# Patient Record
Sex: Female | Born: 1940 | Race: White | Hispanic: No | Marital: Single | Smoking: Never smoker
Health system: Southern US, Community
[De-identification: ages and names within clinical notes are randomized; demographics above are authoritative.]

## PROBLEM LIST (undated history)

## (undated) DIAGNOSIS — Z86010 Personal history of colonic polyps: Secondary | ICD-10-CM

## (undated) DIAGNOSIS — J329 Chronic sinusitis, unspecified: Secondary | ICD-10-CM

## (undated) DIAGNOSIS — E785 Hyperlipidemia, unspecified: Secondary | ICD-10-CM

## (undated) DIAGNOSIS — M1990 Unspecified osteoarthritis, unspecified site: Secondary | ICD-10-CM

## (undated) DIAGNOSIS — M810 Age-related osteoporosis without current pathological fracture: Secondary | ICD-10-CM

## (undated) HISTORY — PX: COLONOSCOPY: SHX174

## (undated) HISTORY — PX: APPENDECTOMY: SHX54

---

## 2004-09-12 ENCOUNTER — Ambulatory Visit: Payer: Self-pay | Admitting: Family Medicine

## 2005-02-18 ENCOUNTER — Ambulatory Visit: Payer: Self-pay | Admitting: Unknown Physician Specialty

## 2005-11-06 ENCOUNTER — Ambulatory Visit: Payer: Self-pay | Admitting: Family Medicine

## 2006-12-30 ENCOUNTER — Ambulatory Visit: Payer: Self-pay | Admitting: Family Medicine

## 2010-09-24 ENCOUNTER — Ambulatory Visit: Payer: Self-pay | Admitting: Unknown Physician Specialty

## 2013-04-27 ENCOUNTER — Emergency Department: Payer: Self-pay | Admitting: Emergency Medicine

## 2013-04-27 LAB — CBC WITH DIFFERENTIAL/PLATELET
BASOS PCT: 1.1 %
Basophil #: 0.1 10*3/uL (ref 0.0–0.1)
EOS PCT: 3.3 %
Eosinophil #: 0.3 10*3/uL (ref 0.0–0.7)
HCT: 38.7 % (ref 35.0–47.0)
HGB: 13.5 g/dL (ref 12.0–16.0)
LYMPHS ABS: 2.7 10*3/uL (ref 1.0–3.6)
Lymphocyte %: 36.1 %
MCH: 32.5 pg (ref 26.0–34.0)
MCHC: 34.8 g/dL (ref 32.0–36.0)
MCV: 93 fL (ref 80–100)
MONO ABS: 0.8 x10 3/mm (ref 0.2–0.9)
Monocyte %: 10.3 %
NEUTROS ABS: 3.7 10*3/uL (ref 1.4–6.5)
Neutrophil %: 49.2 %
PLATELETS: 205 10*3/uL (ref 150–440)
RBC: 4.15 10*6/uL (ref 3.80–5.20)
RDW: 12.7 % (ref 11.5–14.5)
WBC: 7.5 10*3/uL (ref 3.6–11.0)

## 2013-04-27 LAB — BASIC METABOLIC PANEL
Anion Gap: 2 — ABNORMAL LOW (ref 7–16)
BUN: 10 mg/dL (ref 7–18)
CALCIUM: 9.4 mg/dL (ref 8.5–10.1)
CHLORIDE: 107 mmol/L (ref 98–107)
CREATININE: 0.57 mg/dL — AB (ref 0.60–1.30)
Co2: 29 mmol/L (ref 21–32)
Glucose: 81 mg/dL (ref 65–99)
OSMOLALITY: 274 (ref 275–301)
Potassium: 4.1 mmol/L (ref 3.5–5.1)
Sodium: 138 mmol/L (ref 136–145)

## 2015-11-03 ENCOUNTER — Encounter: Payer: Self-pay | Admitting: *Deleted

## 2015-11-06 ENCOUNTER — Ambulatory Visit: Payer: Medicare Other | Admitting: Anesthesiology

## 2015-11-06 ENCOUNTER — Ambulatory Visit
Admission: RE | Admit: 2015-11-06 | Discharge: 2015-11-06 | Disposition: A | Discharge status: Home / Self Care | Payer: Medicare Other | Source: Ambulatory Visit | Attending: Unknown Physician Specialty | Admitting: Unknown Physician Specialty

## 2015-11-06 ENCOUNTER — Encounter
Admission: RE | Disposition: A | Discharge status: Home / Self Care | Payer: Self-pay | Source: Ambulatory Visit | Attending: Unknown Physician Specialty

## 2015-11-06 DIAGNOSIS — E669 Obesity, unspecified: Secondary | ICD-10-CM | POA: Diagnosis not present

## 2015-11-06 DIAGNOSIS — M1990 Unspecified osteoarthritis, unspecified site: Secondary | ICD-10-CM | POA: Diagnosis not present

## 2015-11-06 DIAGNOSIS — Z86010 Personal history of colonic polyps: Secondary | ICD-10-CM | POA: Insufficient documentation

## 2015-11-06 DIAGNOSIS — M810 Age-related osteoporosis without current pathological fracture: Secondary | ICD-10-CM | POA: Insufficient documentation

## 2015-11-06 DIAGNOSIS — K635 Polyp of colon: Secondary | ICD-10-CM | POA: Insufficient documentation

## 2015-11-06 DIAGNOSIS — K640 First degree hemorrhoids: Secondary | ICD-10-CM | POA: Diagnosis not present

## 2015-11-06 DIAGNOSIS — Z880 Allergy status to penicillin: Secondary | ICD-10-CM | POA: Diagnosis not present

## 2015-11-06 DIAGNOSIS — Z6835 Body mass index (BMI) 35.0-35.9, adult: Secondary | ICD-10-CM | POA: Insufficient documentation

## 2015-11-06 DIAGNOSIS — Z1211 Encounter for screening for malignant neoplasm of colon: Secondary | ICD-10-CM | POA: Diagnosis present

## 2015-11-06 DIAGNOSIS — E785 Hyperlipidemia, unspecified: Secondary | ICD-10-CM | POA: Insufficient documentation

## 2015-11-06 HISTORY — DX: Age-related osteoporosis without current pathological fracture: M81.0

## 2015-11-06 HISTORY — DX: Personal history of colonic polyps: Z86.010

## 2015-11-06 HISTORY — DX: Hyperlipidemia, unspecified: E78.5

## 2015-11-06 HISTORY — DX: Unspecified osteoarthritis, unspecified site: M19.90

## 2015-11-06 HISTORY — DX: Chronic sinusitis, unspecified: J32.9

## 2015-11-06 HISTORY — PX: COLONOSCOPY WITH PROPOFOL: SHX5780

## 2015-11-06 SURGERY — COLONOSCOPY WITH PROPOFOL
Anesthesia: General

## 2015-11-06 MED ORDER — PROPOFOL 500 MG/50ML IV EMUL
INTRAVENOUS | Status: DC | PRN
  Administered 2015-11-06: 75 ug/kg/min via INTRAVENOUS

## 2015-11-06 MED ORDER — PROPOFOL 10 MG/ML IV BOLUS
INTRAVENOUS | Status: DC | PRN
  Administered 2015-11-06: 30 mg via INTRAVENOUS
  Administered 2015-11-06: 20 mg via INTRAVENOUS

## 2015-11-06 MED ORDER — MIDAZOLAM HCL 5 MG/5ML IJ SOLN
INTRAMUSCULAR | Status: DC | PRN
  Administered 2015-11-06 (×2): 0.5 mg via INTRAVENOUS

## 2015-11-06 MED ORDER — LIDOCAINE HCL (PF) 2 % IJ SOLN
INTRAMUSCULAR | Status: DC | PRN
  Administered 2015-11-06: 50 mg

## 2015-11-06 MED ORDER — FENTANYL CITRATE (PF) 100 MCG/2ML IJ SOLN
INTRAMUSCULAR | Status: DC | PRN
  Administered 2015-11-06: 50 ug via INTRAVENOUS

## 2015-11-06 MED ORDER — PHENYLEPHRINE HCL 10 MG/ML IJ SOLN
INTRAMUSCULAR | Status: DC | PRN
  Administered 2015-11-06: 100 ug via INTRAVENOUS
  Administered 2015-11-06: 200 ug via INTRAVENOUS

## 2015-11-06 MED ORDER — SODIUM CHLORIDE 0.9 % IV SOLN: INTRAVENOUS | Status: DC

## 2015-11-06 MED ORDER — SODIUM CHLORIDE 0.9 % IV SOLN
INTRAVENOUS | Status: DC
  Administered 2015-11-06: 1000 mL via INTRAVENOUS

## 2015-11-06 MED ORDER — EPHEDRINE SULFATE 50 MG/ML IJ SOLN
INTRAMUSCULAR | Status: DC | PRN
  Administered 2015-11-06: 5 mg via INTRAVENOUS
  Administered 2015-11-06: 10 mg via INTRAVENOUS
  Administered 2015-11-06 (×2): 5 mg via INTRAVENOUS

## 2015-11-06 NOTE — Transfer of Care (Signed)
Immediate Anesthesia Transfer of Care Note  Patient: Rhonda Bean  Procedure(s) Performed: Procedure(s): COLONOSCOPY WITH PROPOFOL (N/A)  Patient Location: PACU  Anesthesia Type:General  Level of Consciousness: sedated  Airway & Oxygen Therapy: Patient Spontanous Breathing and Patient connected to nasal cannula oxygen  Post-op Assessment: Report given to RN and Post -op Vital signs reviewed and stable  Post vital signs: Reviewed and stable  Last Vitals:  Vitals:   11/06/15 0746 11/06/15 0850  BP: 131/74 (!) 105/57  Pulse: 82   Resp: 16 16  Temp: 36.5 C (!) 36 C    Last Pain:  Vitals:   11/06/15 0746  TempSrc: Tympanic         Complications: No apparent anesthesia complications

## 2015-11-06 NOTE — H&P (Signed)
   Primary Care Physician:  No PCP Per Patient Primary Gastroenterologist:  Dr. Mechele CollinElliott  Pre-Procedure History & Physical: HPI:  Rhonda Bean is a 75 y.o. female is here [facility] an [facility]   Past Medical History:  Diagnosis Date  . Arthritis   . Chronic sinusitis   . History of colon polyps   . Hyperlipidemia   . Osteoporosis, postmenopausal     Past Surgical History:  Procedure Laterality Date  . APPENDECTOMY    . COLONOSCOPY      Prior to Admission medications   Medication Sig Start Date End Date Taking? Authorizing Provider  naproxen sodium (ANAPROX) 220 MG tablet Take 220 mg by mouth 2 (two) times daily with a meal.   Yes Historical Provider, MD  psyllium (REGULOID) 0.52 g capsule Take 0.52 g by mouth daily.    Historical Provider, MD    Allergies as of 10/27/2015  . (Not on File)    History reviewed. No pertinent family history.  Social History   Social History  . Marital status: Single    Spouse name: N/A  . Number of children: N/A  . Years of education: N/A   Occupational History  . Not on file.   Social History Main Topics  . Smoking status: Never Smoker  . Smokeless tobacco: Never Used  . Alcohol use No  . Drug use: No  . Sexual activity: Not on file   Other Topics Concern  . Not on file   Social History Narrative  . No narrative on file    Review of Systems: See HPI, otherwise negative ROS  Physical Exam: BP 131/74   Pulse 82   Temp 97.7 F (36.5 C) (Tympanic)   Resp 16   Ht 5' (1.524 m)   Wt 81.6 kg (180 lb)   SpO2 97%   BMI 35.15 kg/m  General:   Alert,  pleasant and cooperative in NAD Head:  Normocephalic and atraumatic. Neck:  Supple; no masses or thyromegaly. Lungs:  Clear throughout to auscultation.    Heart:  Regular rate and rhythm. Abdomen:  Soft, nontender and nondistended. Normal bowel sounds, without guarding, and without rebound.   Neurologic:  Alert and  oriented x4;  grossly normal  neurologically.  Impression/Plan: Rhonda Bean is here for an colonoscopy to be [facility]  Risks, benefits, limitations, and alternatives regarding  colonoscopy have been reviewed with the patient.  Questions have been answered.  All parties agreeable.   Lynnae PrudeELLIOTT, Lasheka Kempner, MD  11/06/2015, 8:15 AM

## 2015-11-06 NOTE — Op Note (Signed)
[facility] Gastroenterology Patient Name: Rhonda Bean Procedure Date: 11/06/2015 8:14 AM MRN: [id number] Account #: [id number] Date of Birth: Jul 18, 1940 Admit Type: Outpatient Age: [age] Room[facility] ENDO ROOM 4 Gender: Female Note Status: Finalized Procedure:            Colonoscopy Indications:          High risk colon cancer surveillance: Personal history                        of colonic polyps Providers:            Scot Junobert T. Elliott, MD Referring MD:         No Local Md, MD (Referring MD) Medicines:            Propofol per Anesthesia Complications:        No immediate complications. Procedure:            Pre-Anesthesia Assessment:                       - After reviewing the risks and benefits, the patient                        was deemed in satisfactory condition to undergo the                        procedure.                       After obtaining informed consent, the colonoscope was                        passed under direct vision. Throughout the procedure,                        the patient's blood pressure, pulse, and oxygen                        saturations were monitored continuously. The                        Colonoscope was introduced through the anus and                        advanced to the the cecum, identified by appendiceal                        orifice and ileocecal valve. The colonoscopy was                        somewhat difficult due to significant looping.                        Successful completion of the procedure was aided by                        applying abdominal pressure. The patient tolerated the                        procedure well. The quality of the bowel preparation  was excellent. Findings:      A diminutive polyp was found in the hepatic flexure. The polyp was       sessile. The polyp was removed with a jumbo cold forceps. Resection and       retrieval were complete.      A few sessile polyps  were found in the sigmoid colon. The polyps were       diminutive in size. These polyps were removed with a jumbo cold forceps.       Resection and retrieval were complete.      Internal hemorrhoids were found during endoscopy. The hemorrhoids were       small and Grade I (internal hemorrhoids that do not prolapse).      The exam was otherwise without abnormality. Impression:           - One diminutive polyp at the hepatic flexure, removed                        with a jumbo cold forceps. Resected and retrieved.                       - A few diminutive polyps in the sigmoid colon, removed                        with a jumbo cold forceps. Resected and retrieved.                       - Internal hemorrhoids.                       - The examination was otherwise normal. Recommendation:       - Await pathology results. Scot Jun, MD 11/06/2015 8:50:58 AM This report has been signed electronically. Number of Addenda: 0 Note Initiated On: 11/06/2015 8:14 AM Scope Withdrawal Time: 0 hours 13 minutes 57 seconds  Total Procedure Duration: 0 hours 24 minutes 0 seconds       [facility]

## 2015-11-06 NOTE — Anesthesia Postprocedure Evaluation (Signed)
Anesthesia Post Note  Patient: Rhonda Bean  Procedure(s) Performed: Procedure(s) (LRB): COLONOSCOPY WITH PROPOFOL (N/A)  Patient location during evaluation: Endoscopy Anesthesia Type: General Level of consciousness: awake and alert and oriented Pain management: pain level controlled Vital Signs Assessment: post-procedure vital signs reviewed and stable Respiratory status: spontaneous breathing, nonlabored ventilation and respiratory function stable Cardiovascular status: blood pressure returned to baseline and stable Postop Assessment: no signs of nausea or vomiting Anesthetic complications: no    Last Vitals:  Vitals:   11/06/15 0910 11/06/15 0920  BP: 115/63 126/73  Pulse: 72 74  Resp: 20 19  Temp:      Last Pain:  Vitals:   11/06/15 0746  TempSrc: Tympanic                 Rei Medlen

## 2015-11-06 NOTE — Anesthesia Preprocedure Evaluation (Signed)
Anesthesia Evaluation  Patient identified by MRN, date of birth, ID band Patient awake    Reviewed: Allergy & Precautions, NPO status , Patient's Chart, lab work & pertinent test results  Airway Mallampati: II  TM Distance: >3 FB Neck ROM: Full    Dental no notable dental hx.    Pulmonary neg pulmonary ROS, neg sleep apnea, neg COPD,    breath sounds clear to auscultation- rhonchi (-) wheezing      Cardiovascular Exercise Tolerance: Good (-) hypertension(-) angina Rhythm:Regular Rate:Normal - Systolic murmurs and - Diastolic murmurs    Neuro/Psych negative neurological ROS  negative psych ROS   GI/Hepatic negative GI ROS, Neg liver ROS,   Endo/Other  negative endocrine ROSneg diabetes  Renal/GU negative Renal ROS     Musculoskeletal  (+) Arthritis , Osteoarthritis,    Abdominal (+) + obese,   Peds  Hematology   Anesthesia Other Findings Past Medical History: No date: Arthritis No date: Chronic sinusitis No date: History of colon polyps No date: Hyperlipidemia No date: Osteoporosis, postmenopausal   Reproductive/Obstetrics                             Anesthesia Physical Anesthesia Plan  ASA: II  Anesthesia Plan: General   Post-op Pain Management:    Induction: Intravenous  Airway Management Planned: Natural Airway  Additional Equipment:   Intra-op Plan:   Post-operative Plan:   Informed Consent: I have reviewed the patients History and Physical, chart, labs and discussed the procedure including the risks, benefits and alternatives for the proposed anesthesia with the patient or authorized representative who has indicated his/her understanding and acceptance.   Dental advisory given  Plan Discussed with: Anesthesiologist and CRNA  Anesthesia Plan Comments:         Anesthesia Quick Evaluation

## 2015-11-07 ENCOUNTER — Encounter: Payer: Self-pay | Admitting: Unknown Physician Specialty

## 2015-11-07 LAB — SURGICAL PATHOLOGY

## 2017-03-05 ENCOUNTER — Emergency Department
Admission: EM | Admit: 2017-03-05 | Discharge: 2017-03-05 | Disposition: A | Discharge status: Home / Self Care | Payer: Medicare Other | Attending: Emergency Medicine | Admitting: Emergency Medicine

## 2017-03-05 ENCOUNTER — Emergency Department: Payer: Medicare Other

## 2017-03-05 ENCOUNTER — Other Ambulatory Visit: Payer: Self-pay

## 2017-03-05 DIAGNOSIS — Y929 Unspecified place or not applicable: Secondary | ICD-10-CM | POA: Diagnosis not present

## 2017-03-05 DIAGNOSIS — S3992XA Unspecified injury of lower back, initial encounter: Secondary | ICD-10-CM | POA: Diagnosis present

## 2017-03-05 DIAGNOSIS — S39012A Strain of muscle, fascia and tendon of lower back, initial encounter: Secondary | ICD-10-CM | POA: Insufficient documentation

## 2017-03-05 DIAGNOSIS — Y939 Activity, unspecified: Secondary | ICD-10-CM | POA: Diagnosis not present

## 2017-03-05 DIAGNOSIS — M5416 Radiculopathy, lumbar region: Secondary | ICD-10-CM | POA: Diagnosis not present

## 2017-03-05 DIAGNOSIS — X509XXA Other and unspecified overexertion or strenuous movements or postures, initial encounter: Secondary | ICD-10-CM | POA: Diagnosis not present

## 2017-03-05 DIAGNOSIS — Z79899 Other long term (current) drug therapy: Secondary | ICD-10-CM | POA: Insufficient documentation

## 2017-03-05 DIAGNOSIS — Y999 Unspecified external cause status: Secondary | ICD-10-CM | POA: Insufficient documentation

## 2017-03-05 MED ORDER — KETOROLAC TROMETHAMINE 10 MG PO TABS: 10.0000 mg | ORAL_TABLET | Freq: Three times a day (TID) | ORAL | 0 refills | Status: DC

## 2017-03-05 MED ORDER — KETOROLAC TROMETHAMINE 30 MG/ML IJ SOLN
30.0000 mg | Freq: Once | INTRAMUSCULAR | Status: AC
  Administered 2017-03-05: 30 mg via INTRAMUSCULAR
  Filled 2017-03-05: qty 1

## 2017-03-05 MED ORDER — HYDROCODONE-ACETAMINOPHEN 5-325 MG PO TABS: 1.0000 | ORAL_TABLET | Freq: Three times a day (TID) | ORAL | 0 refills | Status: DC | PRN

## 2017-03-05 MED ORDER — CYCLOBENZAPRINE HCL 5 MG PO TABS: 5.0000 mg | ORAL_TABLET | Freq: Three times a day (TID) | ORAL | 0 refills | Status: DC | PRN

## 2017-03-05 NOTE — ED Provider Notes (Signed)
[facility] Emergency Department Provider Note ____________________________________________  Time seen: 1220  I have reviewed the triage vital signs and the nursing notes.  HISTORY  Chief Complaint  Sciatica  HPI Rhonda Bean is a 77 y.o. female presents herself to the ED for evaluation of intermittent low back pain with some referral into the left thigh and leg for the last 2 weeks.  Patient was evaluated by [facility] urgent care about 2 weeks prior.  During that visit she was placed on prednisone as well as hydrocodone.  A prescription for Skelaxin was not approved by her insurance.  She completed the course but continues to experience pain and tingling to the left anterior thigh primarily.  She denies any foot drop, bladder or bowel incontinence, or leg weakness.  Injury, accident, falls, or trauma to the back.  She denies any history of ongoing or chronic low back pain.  She does note a history of osteoporosis and osteopenia.  She presents to the ED for evaluation management of her symptoms.  Past Medical History:  Diagnosis Date  . Arthritis   . Chronic sinusitis   . History of colon polyps   . Hyperlipidemia   . Osteoporosis, postmenopausal     There are no active problems to display for this patient.   Past Surgical History:  Procedure Laterality Date  . APPENDECTOMY    . COLONOSCOPY    . COLONOSCOPY WITH PROPOFOL N/A 11/06/2015   Procedure: COLONOSCOPY WITH PROPOFOL;  Surgeon: Scot Jun, MD;  Location: [facility] ENDOSCOPY;  Service: Endoscopy;  Laterality: N/A;    Prior to Admission medications   Medication Sig Start Date End Date Taking? Authorizing Provider  cyclobenzaprine (FLEXERIL) 5 MG tablet Take 1 tablet (5 mg total) by mouth 3 (three) times daily as needed for muscle spasms. 03/05/17   Neiman Roots, Charlesetta Ivory, PA-C  HYDROcodone-acetaminophen (NORCO) 5-325 MG tablet Take 1 tablet by mouth 3 (three) times daily as needed. 03/05/17    Marshawn Ninneman, Charlesetta Ivory, PA-C  ketorolac (TORADOL) 10 MG tablet Take 1 tablet (10 mg total) by mouth every 8 (eight) hours. 03/05/17   Ekta Dancer, Charlesetta Ivory, PA-C  naproxen sodium (ANAPROX) 220 MG tablet Take 220 mg by mouth 2 (two) times daily with a meal.    [provider]  psyllium (REGULOID) 0.52 g capsule Take 0.52 g by mouth daily.    [provider]    Allergies Penicillins  No family history on file.  Social History Social History   Tobacco Use  . Smoking status: Never Smoker  . Smokeless tobacco: Never Used  Substance Use Topics  . Alcohol use: No  . Drug use: No    Review of Systems  Constitutional: Negative for fever. Cardiovascular: Negative for chest pain. Respiratory: Negative for shortness of breath. Gastrointestinal: Negative for abdominal pain, vomiting and diarrhea. Genitourinary: Negative for dysuria. Negative for incontinence Musculoskeletal: Positive for back pain with left lumbar radiculopathy.. Skin: Negative for rash. Neurological: Negative for headaches, focal weakness or numbness. ____________________________________________  PHYSICAL EXAM:  VITAL SIGNS: ED Triage Vitals  Enc Vitals Group     BP 03/05/17 1130 (!) 149/81     Pulse Rate 03/05/17 1130 89     Resp 03/05/17 1130 16     Temp 03/05/17 1130 98.1 F (36.7 C)     Temp Source 03/05/17 1130 Oral     SpO2 03/05/17 1130 95 %     Weight 03/05/17 1131 170 lb (77.1 kg)  Height 03/05/17 1131 5' (1.524 m)     Head Circumference --      Peak Flow --      Pain Score 03/05/17 1123 7     Pain Loc --      Pain Edu? --      Excl. in GC? --     Constitutional: Alert and oriented. Well appearing and in no distress. Head: Normocephalic and atraumatic. Cardiovascular: Normal rate, regular rhythm. Normal distal pulses. Respiratory: Normal respiratory effort. No wheezes/rales/rhonchi. Gastrointestinal: Soft and nontender. No distention. Musculoskeletal: No spinal  alignment without midline tenderness, spasm, deformity, or step-off.  Patient transitions from sit to stand without assistance.  She is able to demonstrate normal lumbar flexion and extension range.  She localizes paresthesias to the anterior lateral left thigh.  She also reports some mild lateral calf discomfort. Nontender with normal range of motion in all extremities.  Neurologic: Cranial nerves II through XII grossly intact.  Normal LE DTRs bilaterally.  Normal gait without ataxia. Normal speech and language. No gross focal neurologic deficits are appreciated. Skin:  Skin is warm, dry and intact. No rash noted. ___________________________________________   RADIOLOGY  Lumbar Spine  IMPRESSION: 1. No definite acute findings. 2. 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 3. Chronic L1 compression deformity.  I, Luca Dyar, Charlesetta IvoryJenise V Bacon, personally viewed and evaluated these images (plain radiographs) as part of my medical decision making, as well as reviewing the written report by the radiologist. ____________________________________________  PROCEDURES  Procedures Toradol 30 mg IM ____________________________________________  INITIAL IMPRESSION / ASSESSMENT AND PLAN / ED COURSE  Geriatric patient with a ED evaluation of a 2-week but of intermittent low back pain with right lower extremity referral.  Patient's exam is consistent with some mild lumbar strain with left lumbar radiculopathy.  Patient's x-ray shows some mild anterior listhesis of L3 on 4 was consistent with her radicular symptoms.  She otherwise is neurologically intact on presentation.  She reports improvement of her symptoms after medication administration in the ED.  She is discharged at this time with a prescription for ketorolac, cyclobenzaprine, and hydrocodone.  She will follow-up with her primary care provider  I reviewed the patient's prescription history over the last 12 months in  the multi-state controlled substances database(s) that includes [place], [place], [place], [place], [place], [place], [place], [place], [place], [place], and [place].  Results were notable for her recent narcotic prescription 02/19/2017. ____________________________________________  FINAL CLINICAL IMPRESSION(S) / ED DIAGNOSES  Final diagnoses:  Lumbar strain, initial encounter  Acute left lumbar radiculopathy      Lissa HoardMenshew, Judyann Casasola V Bacon, PA-C 03/05/17 1907    Emily FilbertWilliams, Jonathan E, MD 03/06/17 [phone]

## 2017-03-05 NOTE — Discharge Instructions (Signed)
Your exam and x-ray are consistent with a lumbar strain and mild lumbar nerve irritation. Take the prescription meds as directed. Follow-up with your provider or Dr. Ernest PineHooten for continued symptoms. Return to the ED immediately for leg weakness or incontinence.

## 2017-03-05 NOTE — ED Triage Notes (Signed)
Pt c/o left lower back pain that radiates into the buttock and leg for the past 2 weeks.

## 2017-03-11 ENCOUNTER — Encounter: Payer: Self-pay | Admitting: Emergency Medicine

## 2017-03-11 ENCOUNTER — Other Ambulatory Visit: Payer: Self-pay

## 2017-03-11 ENCOUNTER — Emergency Department
Admission: EM | Admit: 2017-03-11 | Discharge: 2017-03-11 | Disposition: A | Discharge status: Home / Self Care | Payer: Medicare Other | Attending: Emergency Medicine | Admitting: Emergency Medicine

## 2017-03-11 ENCOUNTER — Emergency Department: Payer: Medicare Other

## 2017-03-11 DIAGNOSIS — Z79899 Other long term (current) drug therapy: Secondary | ICD-10-CM | POA: Insufficient documentation

## 2017-03-11 DIAGNOSIS — M1612 Unilateral primary osteoarthritis, left hip: Secondary | ICD-10-CM | POA: Diagnosis not present

## 2017-03-11 DIAGNOSIS — M79604 Pain in right leg: Secondary | ICD-10-CM | POA: Diagnosis present

## 2017-03-11 LAB — COMPREHENSIVE METABOLIC PANEL
ALBUMIN: 4.1 g/dL (ref 3.5–5.0)
ALT: 21 U/L (ref 14–54)
ANION GAP: 11 (ref 5–15)
AST: 33 U/L (ref 15–41)
Alkaline Phosphatase: 68 U/L (ref 38–126)
BUN: 17 mg/dL (ref 6–20)
CALCIUM: 9.6 mg/dL (ref 8.9–10.3)
CO2: 22 mmol/L (ref 22–32)
Chloride: 102 mmol/L (ref 101–111)
Creatinine, Ser: 0.75 mg/dL (ref 0.44–1.00)
GFR calc Af Amer: >60 mL/min (ref >60)
GFR calc non Af Amer: >60 mL/min (ref >60)
GLUCOSE: 112 mg/dL — AB (ref 65–99)
Potassium: 3.5 mmol/L (ref 3.5–5.1)
SODIUM: 135 mmol/L (ref 135–145)
Total Bilirubin: 0.7 mg/dL (ref 0.3–1.2)
Total Protein: 7.6 g/dL (ref 6.5–8.1)

## 2017-03-11 LAB — CBC WITH DIFFERENTIAL/PLATELET
BASOS PCT: 1 %
Basophils Absolute: 0.1 10*3/uL (ref 0–0.1)
EOS ABS: 0.1 10*3/uL (ref 0–0.7)
Eosinophils Relative: 1 %
HCT: 38.8 % (ref 35.0–47.0)
HEMOGLOBIN: 13.4 g/dL (ref 12.0–16.0)
Lymphocytes Relative: 14 %
Lymphs Abs: 1.5 10*3/uL (ref 1.0–3.6)
MCH: 32.4 pg (ref 26.0–34.0)
MCHC: 34.6 g/dL (ref 32.0–36.0)
MCV: 93.7 fL (ref 80.0–100.0)
Monocytes Absolute: 0.7 10*3/uL (ref 0.2–0.9)
Monocytes Relative: 7 %
Neutro Abs: 8.4 10*3/uL — ABNORMAL HIGH (ref 1.4–6.5)
Neutrophils Relative %: 77 %
Platelets: 187 10*3/uL (ref 150–440)
RBC: 4.14 MIL/uL (ref 3.80–5.20)
RDW: 12.6 % (ref 11.5–14.5)
WBC: 10.7 10*3/uL (ref 3.6–11.0)

## 2017-03-11 LAB — MAGNESIUM: Magnesium: 1.6 mg/dL — ABNORMAL LOW (ref 1.7–2.4)

## 2017-03-11 MED ORDER — MELOXICAM 7.5 MG PO TABS: 7.5000 mg | ORAL_TABLET | Freq: Every day | ORAL | 0 refills | Status: AC

## 2017-03-11 MED ORDER — MAGNESIUM 65 MG PO TABS: 1.0000 | ORAL_TABLET | Freq: Once | ORAL | 0 refills | Status: AC

## 2017-03-11 MED ORDER — MAGNESIUM CHLORIDE 64 MG PO TBEC
1.0000 | DELAYED_RELEASE_TABLET | Freq: Every day | ORAL | Status: DC
  Administered 2017-03-11: 64 mg via ORAL
  Filled 2017-03-11: qty 1

## 2017-03-11 NOTE — ED Provider Notes (Signed)
[facility] Emergency Department Provider Note  ____________________________________________   First MD Initiated Contact with Patient 03/11/17 1348     (approximate)  I have reviewed the triage vital signs and the nursing notes.   HISTORY  Chief Complaint Spasms  HPI Rhonda Bean is a 77 y.o. female is here  complaining of left leg and hip pain for 2 weeks.  Patient was seen in the ED on 03/05/17 at which time x-rays were done of her back.  Patient states that the medication she was prescribed did not help with her leg pain.  She denies any injury.  There is been no traveling and she denies any history of DVT.  She is unaware of any edema to her lower extremity.  She states it is the same as it was when she was seen on the 23rd.  She has taken over-the-counter Aleve with some improvement of her hip pain.  She continues to ambulate with the use of a cane.  She describes her pain as a cramping sensation which is increased with range of motion.  Rates her pain as 5/10.   Past Medical History:  Diagnosis Date  . Arthritis   . Chronic sinusitis   . History of colon polyps   . Hyperlipidemia   . Osteoporosis, postmenopausal     There are no active problems to display for this patient.   Past Surgical History:  Procedure Laterality Date  . APPENDECTOMY    . COLONOSCOPY    . COLONOSCOPY WITH PROPOFOL N/A 11/06/2015   Procedure: COLONOSCOPY WITH PROPOFOL;  Surgeon: Scot Junobert T Elliott, MD;  [facility];  Service: Endoscopy;  Laterality: N/A;    Prior to Admission medications   Medication Sig Start Date End Date Taking? Authorizing Provider  Magnesium 65 MG TABS Take 1 tablet (65 mg total) by mouth once for 1 dose. 03/11/17 03/11/17  Tommi RumpsSummers, Aziyah Provencal L, PA-C  meloxicam (MOBIC) 7.5 MG tablet Take 1 tablet (7.5 mg total) by mouth daily. 03/11/17 03/11/18  Bridget HartshornSummers, Shaletta Hinostroza L, PA-C  psyllium (REGULOID) 0.52 g capsule Take 0.52 g by mouth daily.     [provider]    Allergies Penicillins  No family history on file.  Social History Social History   Tobacco Use  . Smoking status: Never Smoker  . Smokeless tobacco: Never Used  Substance Use Topics  . Alcohol use: No  . Drug use: No    Review of Systems Constitutional: No fever/chills Cardiovascular: Denies chest pain. Respiratory: Denies shortness of breath. Genitourinary: Negative for dysuria. Musculoskeletal: Positive for left lower leg and hip pain. Skin: Negative for rash. Neurological: Negative for headaches, focal weakness or numbness. ____________________________________________   PHYSICAL EXAM:  VITAL SIGNS: ED Triage Vitals [03/11/17 1324]  Enc Vitals Group     BP (!) 145/58     Pulse Rate 62     Resp 20     Temp 98.7 F (37.1 C)     Temp Source Oral     SpO2 98 %     Weight      Height      Head Circumference      Peak Flow      Pain Score 5     Pain Loc      Pain Edu?      Excl. in GC?    Constitutional: Alert and oriented. Well appearing and in no acute distress. Eyes: Conjunctivae are normal.  Head: Atraumatic. Neck: No stridor.  Cardiovascular: Normal rate, regular rhythm. Grossly normal heart sounds.  Good peripheral circulation. Respiratory: Normal respiratory effort.  No retractions. Lungs CTAB. Gastrointestinal: Soft and nontender. No distention.  Musculoskeletal: On examination of the left lower extremity there is no gross deformity, no soft tissue swelling or edema, no ecchymosis or abrasions seen.  Range of motion reproduces pain with abduction.  No warmth or point tenderness is noted.  Homans sign is negative.  Pulses present.  Motor sensory function intact.  Skin intact.  There is no shortening or rotation of the left lower extremity in comparison with the right. Neurologic:  Normal speech and language. No gross focal neurologic deficits are appreciated.  Skin:  Skin is warm, dry and intact. No rash noted. Psychiatric:  Mood and affect are normal. Speech and behavior are normal.  ____________________________________________   LABS (all labs ordered are listed, but only abnormal results are displayed)  Labs Reviewed  CBC WITH DIFFERENTIAL/PLATELET - Abnormal; Notable for the following components:      Result Value   Neutro Abs 8.4 (*)    All other components within normal limits  COMPREHENSIVE METABOLIC PANEL - Abnormal; Notable for the following components:   Glucose, Bld 112 (*)    All other components within normal limits  MAGNESIUM - Abnormal; Notable for the following components:   Magnesium 1.6 (*)    All other components within normal limits    RADIOLOGY  No acute injury is seen.  There is narrowing of the bone space involving the head of the femur.  ____________________________________________   PROCEDURES  Procedure(s) performed: None  Procedures  Critical Care performed: No  ____________________________________________   INITIAL IMPRESSION / ASSESSMENT AND PLAN / ED COURSE Patient does not have a PCP.  She is encouraged to obtain a primary care provider and was given a list of clinics in the area to begin calling to see if they are taking new patients.  Patient was made aware that her magnesium was slightly low and that we would want a prescription for limited amount to see if this helps with her leg cramping.  Patient was also given a prescription for meloxicam 7.5 mg 1 daily with food #14 with no refill.  Patient was made aware that she is not to take any other anti-inflammatories with this medication but that Tylenol would be safe if extra analgesia is needed.  ____________________________________________   FINAL CLINICAL IMPRESSION(S) / ED DIAGNOSES  Final diagnoses:  Right leg pain  Hypomagnesemia  Osteoarthritis of left hip, unspecified osteoarthritis type     ED Discharge Orders        Ordered    meloxicam (MOBIC) 7.5 MG tablet  Daily     03/11/17 1608     Magnesium 65 MG TABS   Once     03/11/17 1608       Note:  This document was prepared using Dragon voice recognition software and may include unintentional dictation errors.    Tommi Rumps, PA-C 03/11/17 1624    Schaevitz, Myra Rude, MD 03/12/17 [phone]

## 2017-03-11 NOTE — ED Notes (Signed)
FN: pt reports left leg pain today, hx of nerve damage in lower back.

## 2017-03-11 NOTE — Discharge Instructions (Signed)
Begin taking magnesium 1 tablet/day.  The first dose was given to you in the emergency department.  Also meloxicam 7.5 mg 1 daily with food.  It is also to your advantage to obtain a primary care provider.  A number of clinics is listed on your discharge papers to begin calling to see if they are taking new patients.  If you continue to have problems with your hip you may want to follow-up with an orthopedist.  Taking the meloxicam once a day should help.  Do not take over-the-counter Aleve with this medication.  You may take Tylenol if you need further pain medication.

## 2017-03-11 NOTE — ED Triage Notes (Signed)
Pt with left leg and hip pain for two weeks.

## 2017-03-11 NOTE — ED Notes (Signed)
See triage note  Presents with pain and muscle spasms to left hip and leg   States she stepped wrong and then caught herself before she fell  States pain became worse

## 2017-04-17 ENCOUNTER — Other Ambulatory Visit: Payer: Self-pay | Admitting: Internal Medicine

## 2017-04-17 DIAGNOSIS — Z1231 Encounter for screening mammogram for malignant neoplasm of breast: Secondary | ICD-10-CM

## 2017-04-30 ENCOUNTER — Ambulatory Visit
Admission: RE | Admit: 2017-04-30 | Discharge: 2017-04-30 | Disposition: A | Discharge status: Home / Self Care | Payer: Medicare Other | Source: Ambulatory Visit | Attending: Internal Medicine | Admitting: Internal Medicine

## 2017-04-30 DIAGNOSIS — Z1231 Encounter for screening mammogram for malignant neoplasm of breast: Secondary | ICD-10-CM | POA: Diagnosis not present

## 2017-07-03 ENCOUNTER — Other Ambulatory Visit: Payer: Self-pay

## 2017-07-03 ENCOUNTER — Encounter: Payer: Self-pay | Admitting: *Deleted

## 2017-07-03 ENCOUNTER — Emergency Department: Payer: Medicare Other

## 2017-07-03 ENCOUNTER — Emergency Department
Admission: EM | Admit: 2017-07-03 | Discharge: 2017-07-04 | Disposition: A | Discharge status: Home / Self Care | Payer: Medicare Other | Attending: Emergency Medicine | Admitting: Emergency Medicine

## 2017-07-03 DIAGNOSIS — Y929 Unspecified place or not applicable: Secondary | ICD-10-CM | POA: Insufficient documentation

## 2017-07-03 DIAGNOSIS — Y9389 Activity, other specified: Secondary | ICD-10-CM | POA: Diagnosis not present

## 2017-07-03 DIAGNOSIS — W01198A Fall on same level from slipping, tripping and stumbling with subsequent striking against other object, initial encounter: Secondary | ICD-10-CM | POA: Diagnosis not present

## 2017-07-03 DIAGNOSIS — Z79899 Other long term (current) drug therapy: Secondary | ICD-10-CM | POA: Diagnosis not present

## 2017-07-03 DIAGNOSIS — S43015A Anterior dislocation of left humerus, initial encounter: Secondary | ICD-10-CM

## 2017-07-03 DIAGNOSIS — S4992XA Unspecified injury of left shoulder and upper arm, initial encounter: Secondary | ICD-10-CM | POA: Diagnosis present

## 2017-07-03 DIAGNOSIS — Y998 Other external cause status: Secondary | ICD-10-CM | POA: Diagnosis not present

## 2017-07-03 MED ORDER — ONDANSETRON HCL 4 MG/2ML IJ SOLN
4.0000 mg | Freq: Once | INTRAMUSCULAR | Status: AC
  Administered 2017-07-03: 4 mg via INTRAVENOUS
  Filled 2017-07-03: qty 2

## 2017-07-03 MED ORDER — OXYCODONE-ACETAMINOPHEN 5-325 MG PO TABS
1.0000 | ORAL_TABLET | ORAL | Status: DC | PRN
  Administered 2017-07-03: 1 via ORAL
  Filled 2017-07-03: qty 1

## 2017-07-03 MED ORDER — ACETAMINOPHEN 325 MG PO TABS
650.0000 mg | ORAL_TABLET | Freq: Four times a day (QID) | ORAL | 0 refills | Status: AC | PRN
Start: 2017-07-03 — End: ?

## 2017-07-03 MED ORDER — PROPOFOL 10 MG/ML IV BOLUS
INTRAVENOUS | Status: AC
  Filled 2017-07-03: qty 20

## 2017-07-03 MED ORDER — PROPOFOL 10 MG/ML IV BOLUS
INTRAVENOUS | Status: AC | PRN
  Administered 2017-07-03 (×2): 30 mg via INTRAVENOUS

## 2017-07-03 MED ORDER — PROPOFOL 10 MG/ML IV BOLUS: 1.0000 mg/kg | Freq: Once | INTRAVENOUS | Status: DC

## 2017-07-03 MED ORDER — NAPROXEN 500 MG PO TABS: 500.0000 mg | ORAL_TABLET | Freq: Two times a day (BID) | ORAL | 0 refills | Status: AC

## 2017-07-03 MED ORDER — MORPHINE SULFATE (PF) 4 MG/ML IV SOLN
4.0000 mg | Freq: Once | INTRAVENOUS | Status: DC
  Filled 2017-07-03: qty 1

## 2017-07-03 MED ORDER — MORPHINE SULFATE (PF) 4 MG/ML IV SOLN
4.0000 mg | Freq: Once | INTRAVENOUS | Status: AC
  Administered 2017-07-03: 4 mg via INTRAVENOUS
  Filled 2017-07-03: qty 1

## 2017-07-03 NOTE — ED Triage Notes (Signed)
Pt to Ed reporting a fall with left shoulder pt. Pt is very upset in lobby and reports the pain is unbearable. Pt reports the arm and shoulder are starting to become numb. Pt has pulses intact and color is appropriate.

## 2017-07-03 NOTE — ED Notes (Signed)
ED Provider at bedside. 

## 2017-07-03 NOTE — ED Notes (Signed)
Pt back from xray explained to pt that her shoulder is dislocated and that she will need to be sedated slightly to put shoulder in place and that requires her to be monitored. Pt verbalized understanding. Took pt and family to room 9.

## 2017-07-03 NOTE — Discharge Instructions (Addendum)
Wear the shoulder immobilizer at all times when not bathing. To shower, remove the immobilizer, but avoid lifting your arm.  Keep your left arm hanging down by your side when not immobilized.

## 2017-07-03 NOTE — ED Provider Notes (Signed)
[facility] Emergency Department Provider Note  ____________________________________________  Time seen: Approximately 11:26 PM  I have reviewed the triage vital signs and the nursing notes.   HISTORY  Chief Complaint Shoulder Injury    HPI Rhonda Bean is a 77 y.o. female reports a pin fall onto outstretched arms that occurred at about 8:00 PM tonight. Immediately afterward she had sudden onset of severe left shoulder pain, nonradiating, difficulty with moving the arm, pain worsening with any attempted range of motion. No alleviating factors. No other injuries. No head trauma or syncope. Complains of paresthesia in the left arm, no weakness or numbness. No history of shoulder surgery or injuries.   no medication or food allergies. Last oral intake was 6:30 PM today. No adverse events with anesthesia in the past.   Past Medical History:  Diagnosis Date  . Arthritis   . Chronic sinusitis   . History of colon polyps   . Hyperlipidemia   . Osteoporosis, postmenopausal      There are no active problems to display for this patient.    Past Surgical History:  Procedure Laterality Date  . APPENDECTOMY    . COLONOSCOPY    . COLONOSCOPY WITH PROPOFOL N/A 11/06/2015   Procedure: COLONOSCOPY WITH PROPOFOL;  Surgeon: Scot Jun, MD;  Location: [facility] ENDOSCOPY;  Service: Endoscopy;  Laterality: N/A;     Prior to Admission medications   Medication Sig Start Date End Date Taking? Authorizing Provider  acetaminophen (TYLENOL) 325 MG tablet Take 2 tablets (650 mg total) by mouth every 6 (six) hours as needed. 07/03/17   Sharman Cheek, MD  meloxicam (MOBIC) 7.5 MG tablet Take 1 tablet (7.5 mg total) by mouth daily. 03/11/17 03/11/18  Tommi Rumps, PA-C  naproxen (NAPROSYN) 500 MG tablet Take 1 tablet (500 mg total) by mouth 2 (two) times daily with a meal. 07/03/17   Sharman Cheek, MD  psyllium (REGULOID) 0.52 g capsule Take 0.52 g by mouth  daily.    [provider]     Allergies Penicillins   History reviewed. No pertinent family history.  Social History Social History   Tobacco Use  . Smoking status: Never Smoker  . Smokeless tobacco: Never Used  Substance Use Topics  . Alcohol use: No  . Drug use: No    Review of Systems  Constitutional:   No fever or chills.   Cardiovascular:   No chest pain or syncope. Respiratory:   No dyspnea or cough. Gastrointestinal:   Negative for abdominal pain, vomiting and diarrhea.  Musculoskeletal:   left shoulder pain as above All other systems reviewed and are negative except as documented above in ROS and HPI.  ____________________________________________   PHYSICAL EXAM:  VITAL SIGNS: ED Triage Vitals  Enc Vitals Group     BP 07/03/17 1939 (!) 196/90     Pulse Rate 07/03/17 1939 95     Resp 07/03/17 1939 16     Temp 07/03/17 1939 98.4 F (36.9 C)     Temp Source 07/03/17 1939 Oral     SpO2 07/03/17 1939 99 %     Weight 07/03/17 1934 165 lb (74.8 kg)     Height 07/03/17 1934 5' (1.524 m)     Head Circumference --      Peak Flow --      Pain Score 07/03/17 1934 10     Pain Loc --      Pain Edu? --      Excl. in  [place]? --     Vital signs reviewed, nursing assessments reviewed.   Constitutional:   Alert and oriented. I'm comfortable, not in distress Eyes:   Conjunctivae are normal. EOMI. PERRL. ENT      Head:   Normocephalic and atraumatic.      Nose:   No congestion/rhinnorhea.       Mouth/Throat:   MMM, no pharyngeal erythema. No peritonsillar mass.       Neck:   No meningismus. Full ROM. Hematological/Lymphatic/Immunilogical:   No cervical lymphadenopathy. Cardiovascular:   RRR. Symmetric bilateral radial and DP pulses.  No murmurs.  Respiratory:   Normal respiratory effort without tachypnea/retractions. Breath sounds are clear and equal bilaterally. No wheezes/rales/rhonchi. Gastrointestinal:   Soft and nontender. Non distended. There is no  CVA tenderness.  No rebound, rigidity, or guarding.  Musculoskeletal:   left shoulder held in internal rotation and elbow flexion. Unable to tolerate any range of motion. Humeral head is palpated anteriorly with emptiness over the deltoid. Neurologic:   Normal speech and language.  Motor grossly intact. No acute focal neurologic deficits are appreciated.  Skin:    Skin is warm, dry and intact. No rash noted.  No petechiae, purpura, or bullae.  ____________________________________________    LABS (pertinent positives/negatives) (all labs ordered are listed, but only abnormal results are displayed) Labs Reviewed - No data to display ____________________________________________   EKG    ____________________________________________    RADIOLOGY  Dg Shoulder Left  Result Date: 07/03/2017 CLINICAL DATA:  Acute LEFT shoulder pain following fall. Initial encounter. EXAM: LEFT SHOULDER - 2+ VIEW COMPARISON:  None. FINDINGS: Anterior inferior subcoracoid dislocation of the humeral head noted. No definite fracture identified. No other acute abnormalities are present. IMPRESSION: Anterior inferior subcoracoid dislocation of the humeral head. No definite fracture. Electronically Signed   By: Harmon Pier M.D.   On: 07/03/2017 20:05   Dg Shoulder Left Portable  Result Date: 07/03/2017 CLINICAL DATA:  Post reduction EXAM: LEFT SHOULDER - 1 VIEW COMPARISON:  07/03/2017 FINDINGS: Reduction of previously noted shoulder dislocation. No fracture. AC joint degenerative change. IMPRESSION: Reduction of shoulder dislocation Electronically Signed   By: Jasmine Pang M.D.   On: 07/03/2017 22:30   Dg Humerus Left  Result Date: 07/03/2017 CLINICAL DATA:  Acute LEFT arm pain following fall. Initial encounter. EXAM: LEFT HUMERUS - 2+ VIEW COMPARISON:  None. FINDINGS: Anterior inferior subcoracoid dislocation of the humeral head identified. No definite fracture noted. No other significant abnormalities  identified. IMPRESSION: Anterior inferior subcoracoid dislocation of the humeral head. No definite fracture. Electronically Signed   By: Harmon Pier M.D.   On: 07/03/2017 20:06    ____________________________________________   PROCEDURES .Sedation Date/Time: 07/03/2017 11:34 PM Performed by: Sharman Cheek, MD Authorized by: Sharman Cheek, MD   Consent:    Consent obtained:  Verbal and written   Consent given by:  Patient   Risks discussed:  Allergic reaction, dysrhythmia, inadequate sedation, nausea, prolonged hypoxia resulting in organ damage, prolonged sedation necessitating reversal, respiratory compromise necessitating ventilatory assistance and intubation and vomiting   Alternatives discussed:  Analgesia without sedation, anxiolysis and regional anesthesia Universal protocol:    Procedure explained and questions answered to patient or proxy's satisfaction: yes     Relevant documents present and verified: yes     Test results available and properly labeled: yes     Imaging studies available: yes     Required blood products, implants, devices, and special equipment available: yes     Site/side marked:  yes     Immediately prior to procedure a time out was called: yes     Patient identity confirmation method:  Verbally with patient, arm band and provided demographic data Indications:    Procedure performed:  Dislocation reduction   Procedure necessitating sedation performed by:  Physician performing sedation   Intended level of sedation:  Deep Pre-sedation assessment:    Time since last food or drink:  3 hours   NPO status caution: urgency dictates proceeding with non-ideal NPO status     ASA classification: class 1 - normal, healthy patient     Neck mobility: normal     Mouth opening:  3 or more finger widths   Thyromental distance:  4 finger widths   Mallampati score:  I - soft palate, uvula, fauces, pillars visible   Pre-sedation assessments completed and reviewed:  airway patency, cardiovascular function, hydration status, mental status, nausea/vomiting, pain level, respiratory function and temperature     Pre-sedation assessment completed:  07/03/2017 9:45 PM Immediate pre-procedure details:    Reassessment: Patient reassessed immediately prior to procedure     Reviewed: vital signs, relevant labs/tests and NPO status     Verified: bag valve mask available, emergency equipment available, intubation equipment available, IV patency confirmed, oxygen available and suction available   Procedure details (see MAR for exact dosages):    Preoxygenation:  Nasal cannula   Sedation:  Propofol   Analgesia:  Morphine   Intra-procedure monitoring:  Blood pressure monitoring, cardiac monitor, continuous pulse oximetry, frequent LOC assessments, frequent vital sign checks and continuous capnometry   Intra-procedure events: hypoxia     Intra-procedure events comment:  Lowest oxygen saturation 83%   Intra-procedure management:  BVM ventilation and airway repositioning   Total Provider sedation time (minutes):  16 Post-procedure details:    Post-sedation assessment completed:  07/03/2017 11:00 PM   Attendance: Constant attendance by certified staff until patient recovered     Recovery: Patient returned to pre-procedure baseline     Post-sedation assessments completed and reviewed: airway patency, cardiovascular function, hydration status, mental status, nausea/vomiting, pain level, respiratory function and temperature     Patient is stable for discharge or admission: yes     Patient tolerance:  Tolerated with difficulty Comments:     patient given initial 30 mg of propofol with inadequate response. After 5 minutes, additional 30 mg of propofol was given. She did briefly require BVM oxygenation for approximately 20 seconds after initial titration of propofol.  remained at bedside until patient was awake alert and following commands which lasted until 10:02 PM.      Reduction of dislocation Date/Time: 07/03/2017 11:40 PM Performed by: Sharman Cheek, MD Authorized by: Sharman Cheek, MD  Consent: Verbal consent obtained. Written consent obtained. Risks and benefits: risks, benefits and alternatives were discussed Consent given by: patient Patient understanding: patient states understanding of the procedure being performed Patient consent: the patient's understanding of the procedure matches consent given Procedure consent: procedure consent matches procedure scheduled Relevant documents: relevant documents present and verified Test results: test results available and properly labeled Imaging studies: imaging studies available Required items: required blood products, implants, devices, and special equipment available Patient identity confirmed: verbally with patient, arm band and provided demographic data Time out: Immediately prior to procedure a "time out" was called to verify the correct patient, procedure, equipment, support staff and site/side marked as required. Local anesthesia used: no  Anesthesia: Local anesthesia used: no  Sedation: Patient sedated: yes Sedatives: propofol Analgesia: morphine  Vitals: Vital signs were monitored during sedation.  Patient tolerance: Patient tolerated the procedure well with no immediate complications Comments: Shoulder immobilizer applied to left upper extremity.     ____________________________________________    CLINICAL IMPRESSION / ASSESSMENT AND PLAN / ED COURSE  Pertinent labs & imaging results that were available during my care of the patient were reviewed by me and considered in my medical decision making (see chart for details).    patient presents with clinically apparent left anterior shoulder dislocation. Due to age, we'll attempt to manipulate and reduce without sedation and patient is agreeable to this attempt but understands that it is unlikely to succeed and we may  need to proceed with sedation to treat her injury.  Clinical Course as of Jul 03 2324  Thu Jul 03, 2017  2137 Pain improved to 6/10 with initial  morphine. Offered additional morphine, pt refused. At that point, attempted manipulation of shoulder, unsuccessful at reduction. Will proceed with sedation and reduction.  Counseled on risks of deep sedation or apnea. Counseled of risk of fracture.    [PS]  2203 Sedation / reduction completed. Pt awake, responsive.   [PS]  2323 Follow up xray shows successful reduction without associated injury. Suitable for DC home.    [PS]    Clinical Course User Index [PS] Sharman Cheek, MD     ____________________________________________   FINAL CLINICAL IMPRESSION(S) / ED DIAGNOSES    Final diagnoses:  Anterior dislocation of left shoulder, initial encounter     ED Discharge Orders        Ordered    naproxen (NAPROSYN) 500 MG tablet  2 times daily with meals     07/03/17 2325    acetaminophen (TYLENOL) 325 MG tablet  Every 6 hours PRN     07/03/17 2325      Portions of this note were generated with dragon dictation software. Dictation errors may occur despite best attempts at proofreading.    Sharman Cheek, MD 07/03/17 [phone]

## 2017-07-04 NOTE — ED Notes (Signed)
Reviewed discharge instructions, immobilizer use, safe movement, follow-up care, and prescriptions with patient. Patient verbalized understanding of all information reviewed. Patient stable, with no distress noted at this time.    E-signature pad not working. Patient signed paper copy.

## 2017-07-04 NOTE — ED Notes (Signed)
Post sedation instructions reviewed with patient. Patient verbalized understanding of all information discussed.

## 2019-01-30 ENCOUNTER — Emergency Department
Admission: EM | Admit: 2019-01-30 | Discharge: 2019-01-30 | Disposition: A | Discharge status: Home / Self Care | Payer: Medicare Other | Attending: Student in an Organized Health Care Education/Training Program | Admitting: Student in an Organized Health Care Education/Training Program

## 2019-01-30 ENCOUNTER — Emergency Department: Payer: Medicare Other

## 2019-01-30 ENCOUNTER — Other Ambulatory Visit: Payer: Self-pay

## 2019-01-30 DIAGNOSIS — N858 Other specified noninflammatory disorders of uterus: Secondary | ICD-10-CM | POA: Diagnosis not present

## 2019-01-30 DIAGNOSIS — S32020A Wedge compression fracture of second lumbar vertebra, initial encounter for closed fracture: Secondary | ICD-10-CM | POA: Insufficient documentation

## 2019-01-30 DIAGNOSIS — X58XXXA Exposure to other specified factors, initial encounter: Secondary | ICD-10-CM | POA: Insufficient documentation

## 2019-01-30 DIAGNOSIS — Y999 Unspecified external cause status: Secondary | ICD-10-CM | POA: Insufficient documentation

## 2019-01-30 DIAGNOSIS — Y9389 Activity, other specified: Secondary | ICD-10-CM | POA: Diagnosis not present

## 2019-01-30 DIAGNOSIS — Y9289 Other specified places as the place of occurrence of the external cause: Secondary | ICD-10-CM | POA: Insufficient documentation

## 2019-01-30 DIAGNOSIS — M5489 Other dorsalgia: Secondary | ICD-10-CM | POA: Diagnosis present

## 2019-01-30 DIAGNOSIS — M549 Dorsalgia, unspecified: Secondary | ICD-10-CM

## 2019-01-30 DIAGNOSIS — Z79899 Other long term (current) drug therapy: Secondary | ICD-10-CM | POA: Diagnosis not present

## 2019-01-30 DIAGNOSIS — N949 Unspecified condition associated with female genital organs and menstrual cycle: Secondary | ICD-10-CM

## 2019-01-30 LAB — URINALYSIS, COMPLETE (UACMP) WITH MICROSCOPIC
Bacteria, UA: NONE SEEN
Bilirubin Urine: NEGATIVE
Glucose, UA: NEGATIVE mg/dL
Hgb urine dipstick: NEGATIVE
Ketones, ur: NEGATIVE mg/dL
Leukocytes,Ua: NEGATIVE
Nitrite: NEGATIVE
Protein, ur: NEGATIVE mg/dL
Specific Gravity, Urine: 1.009 (ref 1.005–1.030)
pH: 6 (ref 5.0–8.0)

## 2019-01-30 MED ORDER — TIZANIDINE HCL 2 MG PO TABS: 2.0000 mg | ORAL_TABLET | Freq: Three times a day (TID) | ORAL | 0 refills | Status: AC

## 2019-01-30 MED ORDER — HYDROCODONE-ACETAMINOPHEN 5-325 MG PO TABS: 1.0000 | ORAL_TABLET | Freq: Four times a day (QID) | ORAL | 0 refills | Status: AC | PRN

## 2019-01-30 NOTE — Discharge Instructions (Addendum)
Follow-up with Dr. Rudene Christians for your back, Dr. Leafy Ro for the adnexal cyst.  Please call and make appointments with both of these physicians.  You have been given pain medication which she should take sparingly.  Muscle relaxer to decrease some of the spasms.  Return emergency department if worsening.

## 2019-01-30 NOTE — ED Triage Notes (Signed)
Back pain began 9 days ago. Denies fall or injury.

## 2019-01-30 NOTE — ED Provider Notes (Signed)
[facility] Emergency Department Provider Note  ____________________________________________   First MD Initiated Contact with Patient 01/30/19 1154     (approximate)  I have reviewed the triage vital signs and the nursing notes.   HISTORY  Chief Complaint Back Pain    HPI Rhonda Bean is a 78 y.o. female presents emergency department complaining of low back pain for 1 week.  No known injury.  Thinks her kidneys are hurting.  She did have some bloody looking urine.  No abdominal pain.  No fever or chills.    Past Medical History:  Diagnosis Date  . Arthritis   . Chronic sinusitis   . History of colon polyps   . Hyperlipidemia   . Osteoporosis, postmenopausal     There are no problems to display for this patient.   Past Surgical History:  Procedure Laterality Date  . APPENDECTOMY    . COLONOSCOPY    . COLONOSCOPY WITH PROPOFOL N/A 11/06/2015   Procedure: COLONOSCOPY WITH PROPOFOL;  Surgeon: Manya Silvas, MD;  Location: [facility] ENDOSCOPY;  Service: Endoscopy;  Laterality: N/A;    Prior to Admission medications   Medication Sig Start Date End Date Taking? Authorizing Provider  acetaminophen (TYLENOL) 325 MG tablet Take 2 tablets (650 mg total) by mouth every 6 (six) hours as needed. 07/03/17   Carrie Mew, MD  HYDROcodone-acetaminophen (NORCO/VICODIN) 5-325 MG tablet Take 1 tablet by mouth every 6 (six) hours as needed for moderate pain. 01/30/19   Caryn Section Linden Dolin, PA-C  naproxen (NAPROSYN) 500 MG tablet Take 1 tablet (500 mg total) by mouth 2 (two) times daily with a meal. 07/03/17   Carrie Mew, MD  psyllium (REGULOID) 0.52 g capsule Take 0.52 g by mouth daily.    [provider]  tiZANidine (ZANAFLEX) 2 MG tablet Take 1 tablet (2 mg total) by mouth 3 (three) times daily. 01/30/19   Versie Starks, PA-C    Allergies Penicillins  No family history on file.  Social History Social History   Tobacco Use  .  Smoking status: Never Smoker  . Smokeless tobacco: Never Used  Substance Use Topics  . Alcohol use: No  . Drug use: No    Review of Systems  Constitutional: No fever/chills Eyes: No visual changes. ENT: No sore throat. Respiratory: Denies cough Genitourinary: Negative for dysuria. Musculoskeletal: Positive for back pain. Skin: Negative for rash.    ____________________________________________   PHYSICAL EXAM:  VITAL SIGNS: ED Triage Vitals  Enc Vitals Group     BP 01/30/19 1121 (!) 171/79     Pulse Rate 01/30/19 1121 93     Resp 01/30/19 1121 20     Temp 01/30/19 1121 98.1 F (36.7 C)     Temp Source 01/30/19 1121 Oral     SpO2 01/30/19 1121 96 %     Weight 01/30/19 1123 165 lb (74.8 kg)     Height 01/30/19 1123 5' (1.524 m)     Head Circumference --      Peak Flow --      Pain Score 01/30/19 1123 6     Pain Loc --      Pain Edu? --      Excl. in [place]? --     Constitutional: Alert and oriented. Well appearing and in no acute distress. Eyes: Conjunctivae are normal.  Head: Atraumatic. Nose: No congestion/rhinnorhea. Mouth/Throat: Mucous membranes are moist.   Neck:  supple no lymphadenopathy noted Cardiovascular: Normal rate, regular rhythm. Heart sounds  are normal Respiratory: Normal respiratory effort.  No retractions, lungs c t a  Abd: soft nontender bs normal all 4 quad GU: deferred Musculoskeletal: FROM all extremities, warm and well perfused, lumbar spine is tender to palpation Neurologic:  Normal speech and language.  Skin:  Skin is warm, dry and intact. No rash noted. Psychiatric: Mood and affect are normal. Speech and behavior are normal.  ____________________________________________   LABS (all labs ordered are listed, but only abnormal results are displayed)  Labs Reviewed  URINALYSIS, COMPLETE (UACMP) WITH MICROSCOPIC - Abnormal; Notable for the following components:      Result Value   Color, Urine YELLOW (*)    APPearance CLEAR (*)     All other components within normal limits   ____________________________________________   ____________________________________________  RADIOLOGY  CT renal stone shows a adnexal cyst CT L-spine shows a L2 vertebral compression fracture at 25%, stable L1 vertebral compression fracture at 50%  ____________________________________________   PROCEDURES  Procedure(s) performed: No  Procedures    ____________________________________________   INITIAL IMPRESSION / ASSESSMENT AND PLAN / ED COURSE  Pertinent labs & imaging results that were available during my care of the patient were reviewed by me and considered in my medical decision making (see chart for details).   Patient is a 78 year old female presents emergency department with low back pain.  See HPI  Physical exam shows patient to appear well.  She is ambulatory.  Lumbar spine is tender.  Abdomen is nontender.  UA is negative  CT renal stone shows adnexal cyst which gives me concerned due to her age. CT L-spine shows a L2 compression fracture.  Discussed findings with patient.  She was encouraged to follow-up with GYN concerning the adnexal cyst, follow-up with orthopedics for the compression fracture.  She is given prescription for Robaxin and Vicodin.  Strict instructions for return if worsening.  Strict instructions to use pain medication over\sparingly.  States she understands will comply.  She is discharged stable condition.    Rhonda Bean was evaluated in Emergency Department on 01/30/2019 for the symptoms described in the history of present illness. She was evaluated in the context of the global COVID-19 pandemic, which necessitated consideration that the patient might be at risk for infection with the SARS-CoV-2 virus that causes COVID-19. Institutional protocols and algorithms that pertain to the evaluation of patients at risk for COVID-19 are in a state of rapid change based on information released by  regulatory bodies including the CDC and federal and state organizations. These policies and algorithms were followed during the patient's care in the ED.   As part of my medical decision making, I reviewed the following data within the electronic MEDICAL RECORD NUMBER Nursing notes reviewed and incorporated, Labs reviewed , Old chart reviewed, Radiograph reviewed , Notes from prior ED visits and [place] Controlled Substance Database  ____________________________________________   FINAL CLINICAL IMPRESSION(S) / ED DIAGNOSES  Final diagnoses:  Back pain  Compression fracture of L2 vertebra, initial encounter (HCC)  Adnexal cyst      NEW MEDICATIONS STARTED DURING THIS VISIT:  Discharge Medication List as of 01/30/2019  2:06 PM    START taking these medications   Details  HYDROcodone-acetaminophen (NORCO/VICODIN) 5-325 MG tablet Take 1 tablet by mouth every 6 (six) hours as needed for moderate pain., Starting Sat 01/30/2019, Normal    tiZANidine (ZANAFLEX) 2 MG tablet Take 1 tablet (2 mg total) by mouth 3 (three) times daily., Starting Sat 01/30/2019, Normal  Note:  This document was prepared using Dragon voice recognition software and may include unintentional dictation errors.    Faythe GheeFisher, Alicja Everitt W, PA-C 01/30/19 1505    Willy Eddyobinson, Patrick, MD 01/30/19 [phone]

## 2019-01-30 NOTE — ED Notes (Signed)
Pt reports back spasms x1 week now. Pt states she has hx of leg spasms but now her kidneys are hurting.

## 2019-05-09 ENCOUNTER — Ambulatory Visit: Payer: Medicare Other | Attending: Internal Medicine

## 2019-05-09 DIAGNOSIS — Z23 Encounter for immunization: Secondary | ICD-10-CM

## 2019-05-09 NOTE — Progress Notes (Signed)
   Covid-19 Vaccination Clinic  Name:  Rhonda Bean    MRN: [id number] DOB: 05-25-1940  05/09/2019  Rhonda Bean was observed post Covid-19 immunization for 15 minutes without incident. She was provided with Vaccine Information Sheet and instruction to access the V-Safe system.   Rhonda Bean was instructed to call 911 with any severe reactions post vaccine: Marland Kitchen Difficulty breathing  . Swelling of face and throat  . A fast heartbeat  . A bad rash all over body  . Dizziness and weakness   Immunizations Administered    Name Date Dose VIS Date Route   Pfizer COVID-19 Vaccine 05/09/2019 12:37 PM 0.3 mL 01/22/2019 Intramuscular   Manufacturer: [employer], [employer]   Lot: [id number]   NDC: [address]-9

## 2019-05-19 ENCOUNTER — Other Ambulatory Visit: Payer: Self-pay | Admitting: Internal Medicine

## 2019-05-19 DIAGNOSIS — R935 Abnormal findings on diagnostic imaging of other abdominal regions, including retroperitoneum: Secondary | ICD-10-CM

## 2019-05-19 DIAGNOSIS — N949 Unspecified condition associated with female genital organs and menstrual cycle: Secondary | ICD-10-CM

## 2019-05-19 IMAGING — CR DG HUMERUS 2V *L*
2 series · 2 of 2 positions shown · non-contrast
Comparison: None.

CLINICAL DATA: Acute LEFT arm pain following fall. Initial
encounter.

EXAM:
LEFT HUMERUS - 2+ VIEW

[humerus ap]
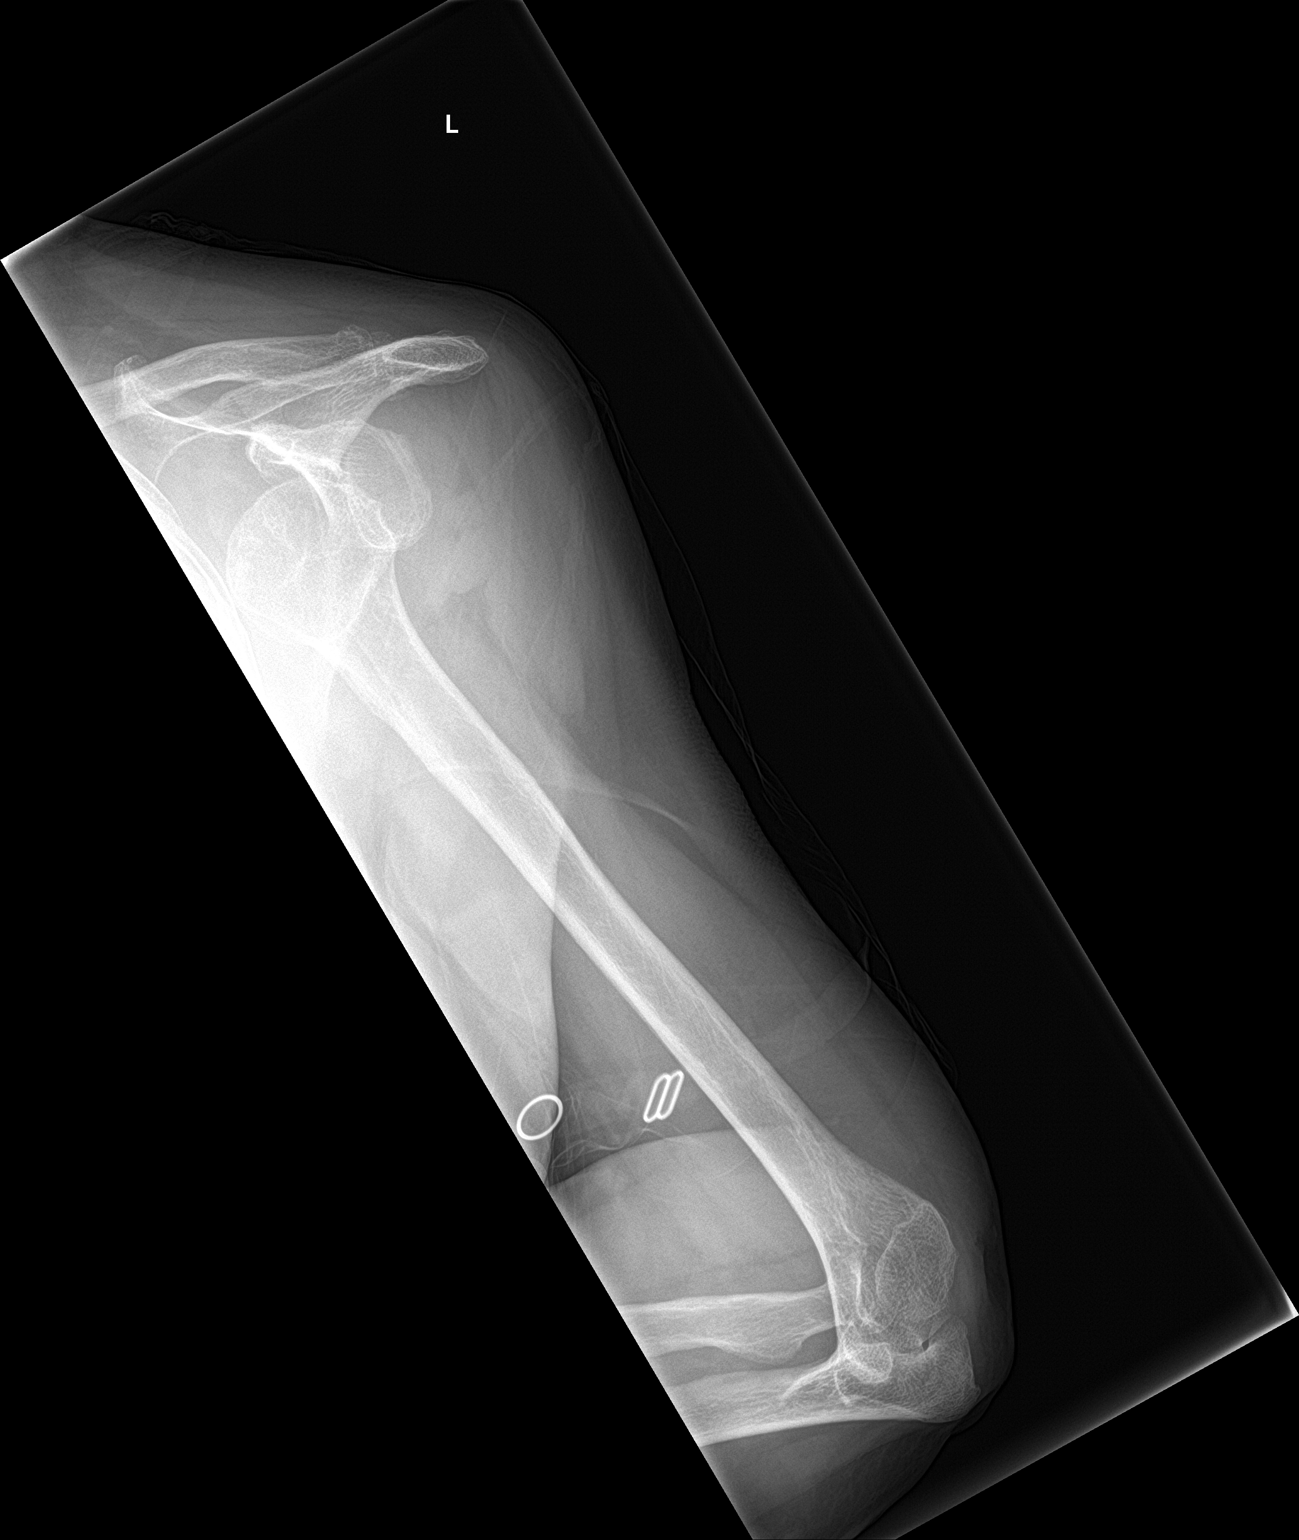

[humerus lat]
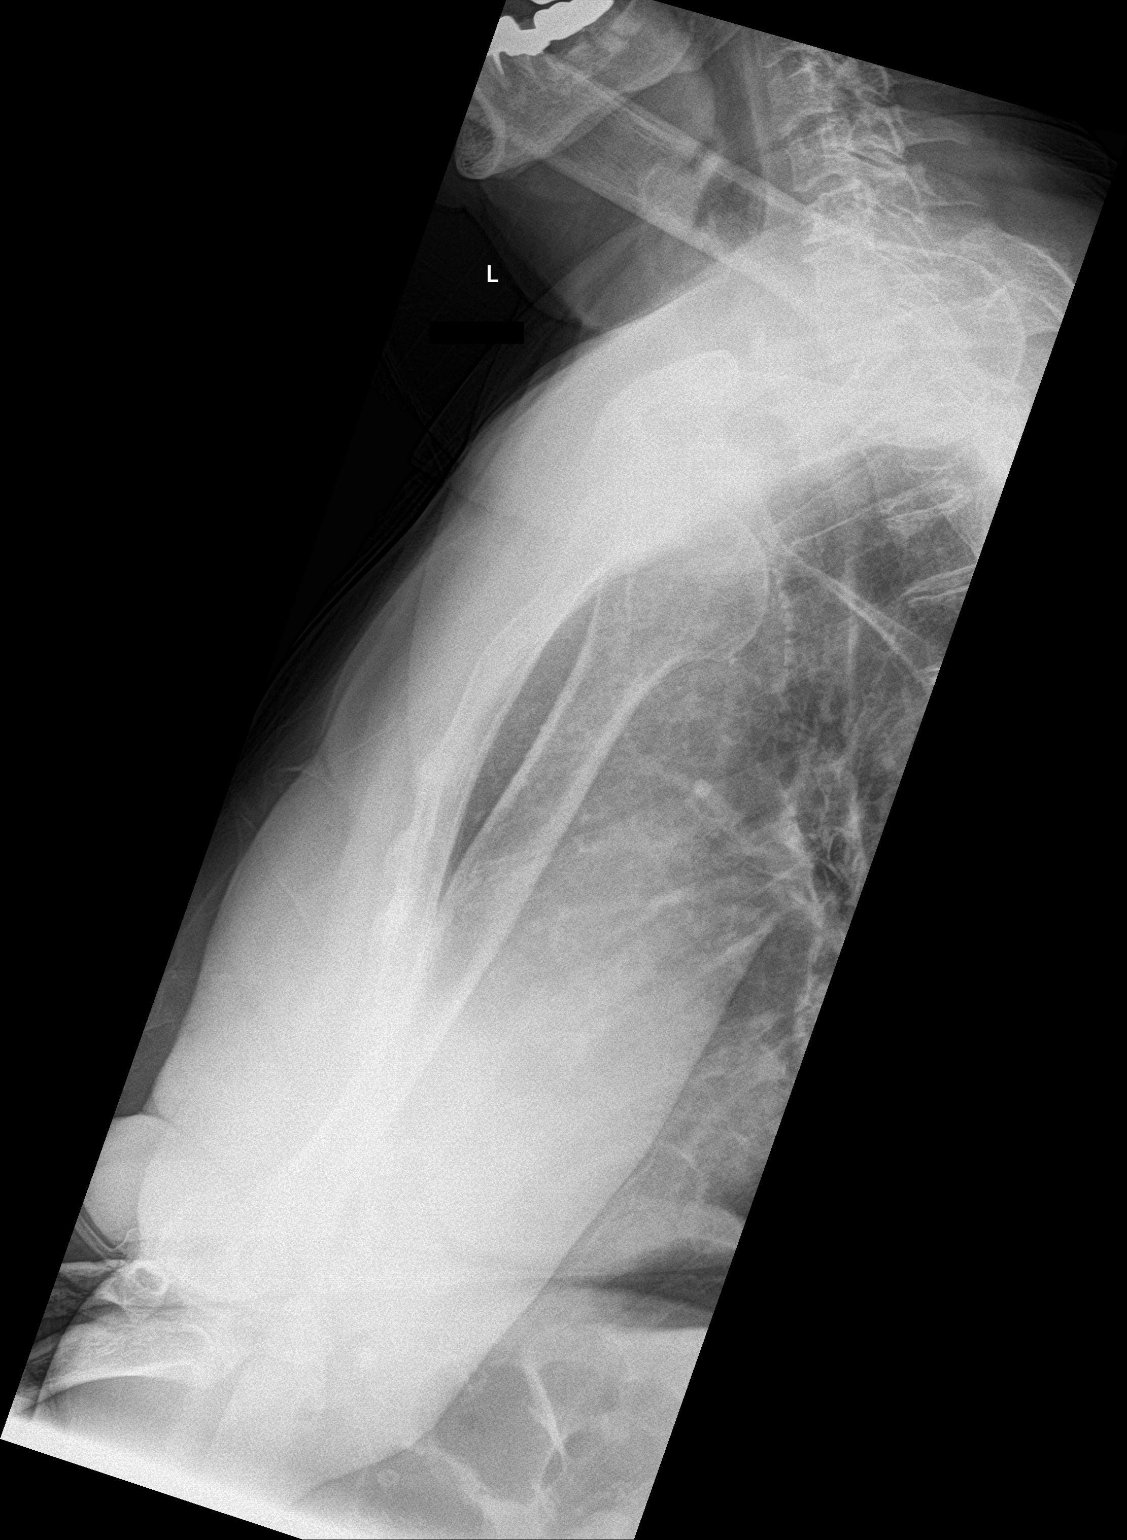

[2 of 2 positions shown; findings below may reference images not displayed]

FINDINGS: Anterior inferior subcoracoid dislocation of the humeral head
identified. No definite fracture noted.

No other significant abnormalities identified.
IMPRESSION: Anterior inferior subcoracoid dislocation of the humeral head. No
definite fracture.

## 2019-05-19 IMAGING — DX DG SHOULDER 1V*L*
2 series · 2 of 2 positions shown · non-contrast
Comparison: 07/03/2017

CLINICAL DATA: Post reduction

EXAM:
LEFT SHOULDER - 1 VIEW

[shoulder ap (1 of 2)]
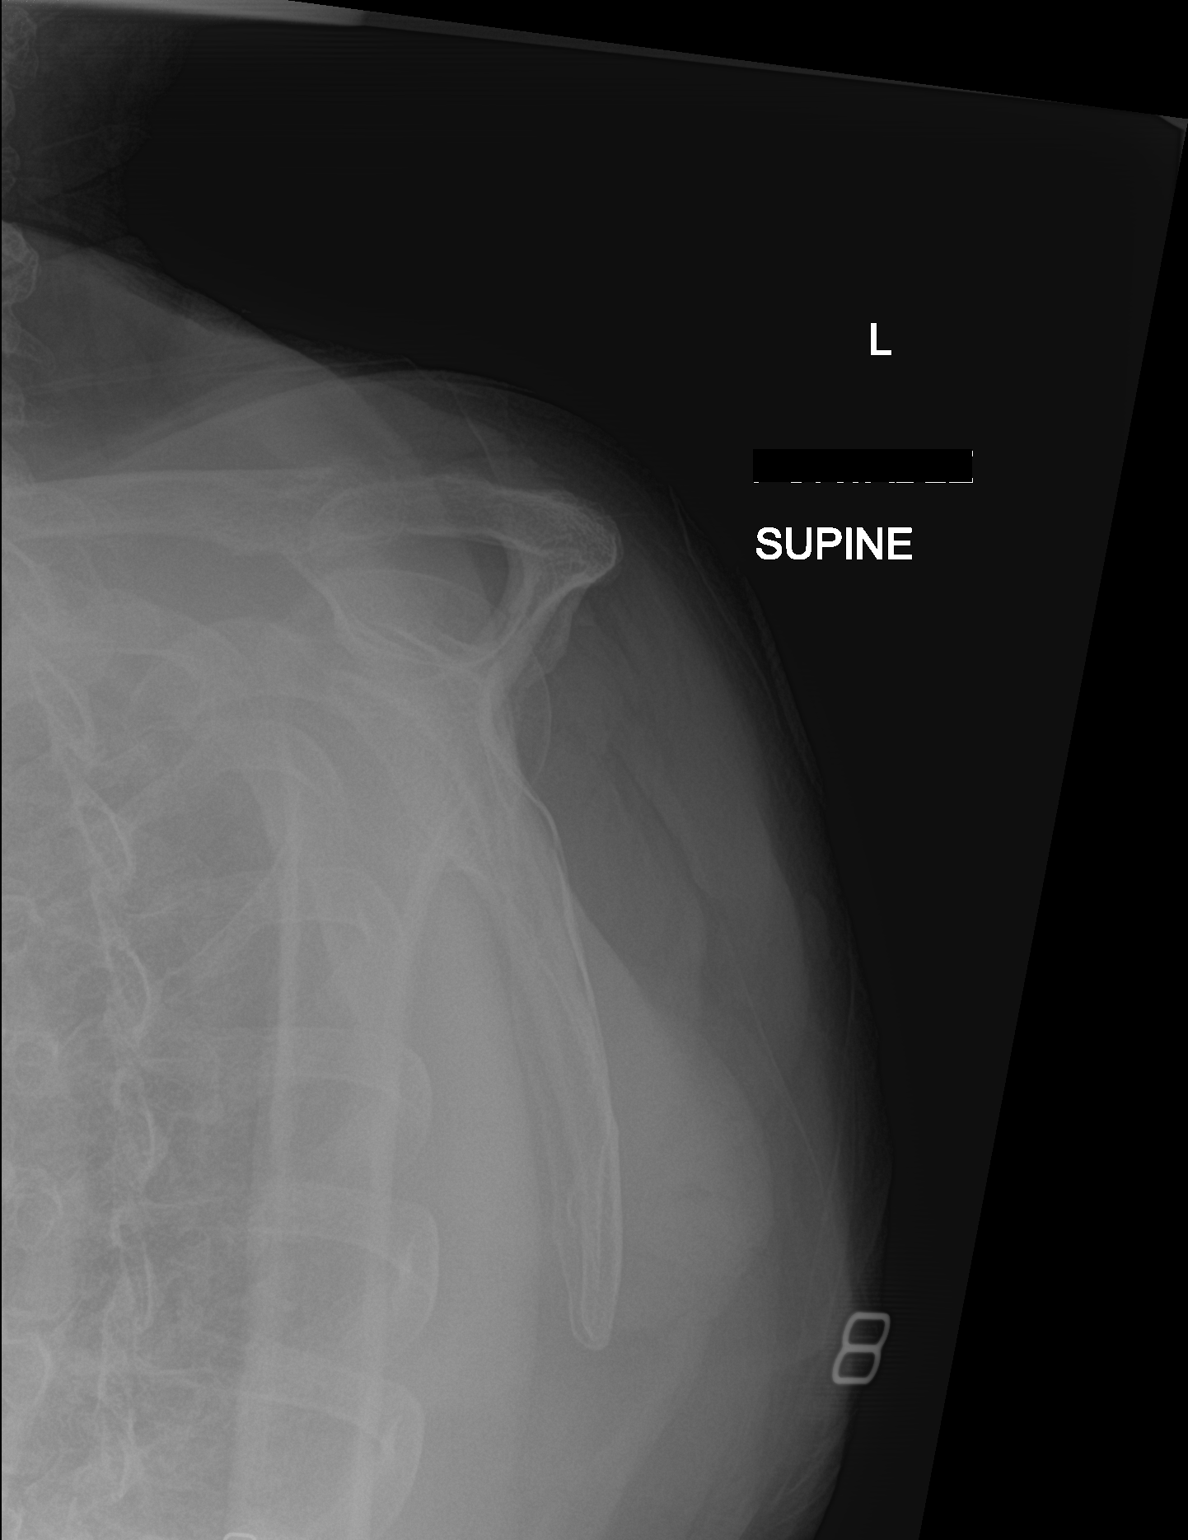

[shoulder ap (2 of 2)]
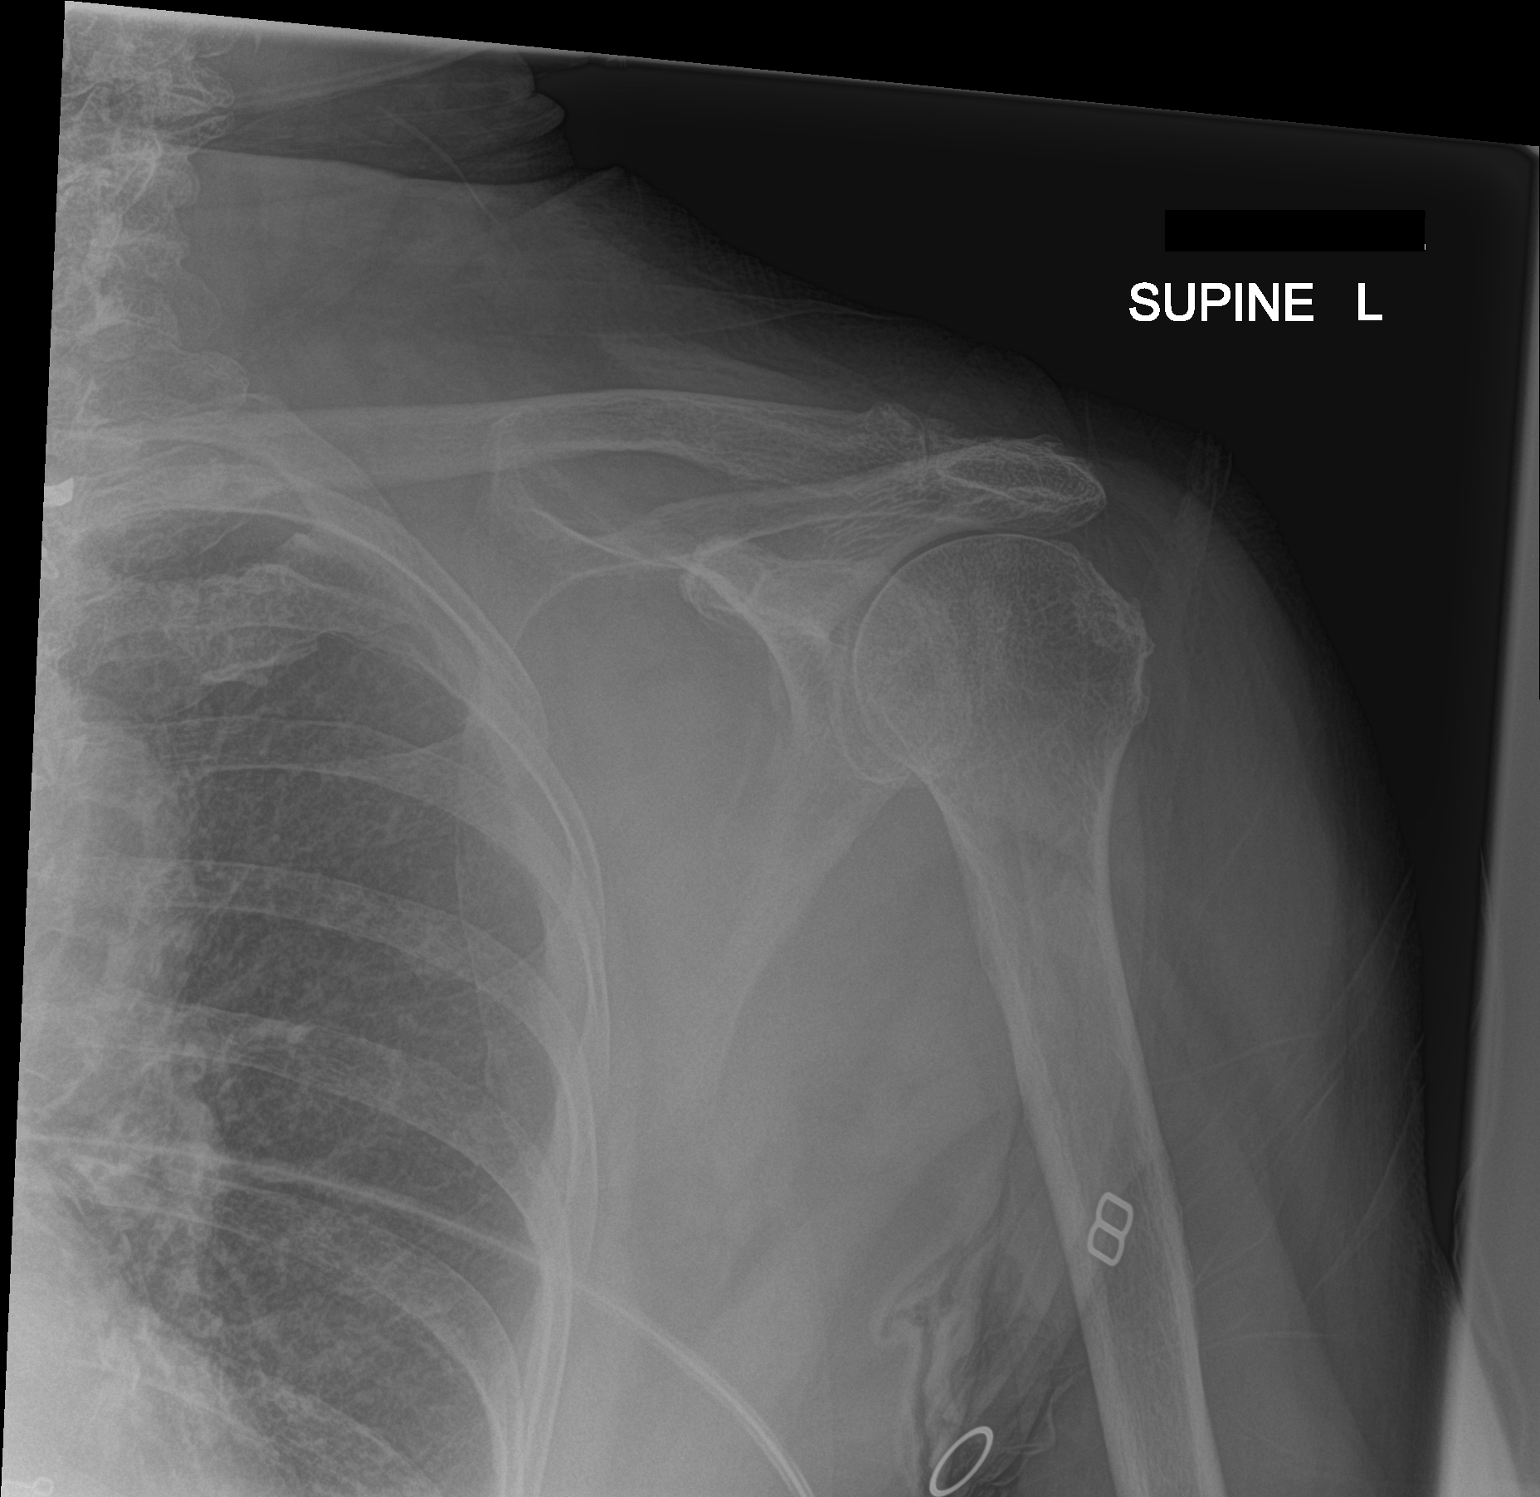

[2 of 2 positions shown; findings below may reference images not displayed]

FINDINGS: Reduction of previously noted shoulder dislocation. No fracture. AC
joint degenerative change.
IMPRESSION: Reduction of shoulder dislocation

## 2019-05-19 IMAGING — CR DG SHOULDER 2+V*L*
2 series · 2 of 2 positions shown · non-contrast
Comparison: None.

CLINICAL DATA: Acute LEFT shoulder pain following fall. Initial
encounter.

EXAM:
LEFT SHOULDER - 2+ VIEW

[shoulder grashey]
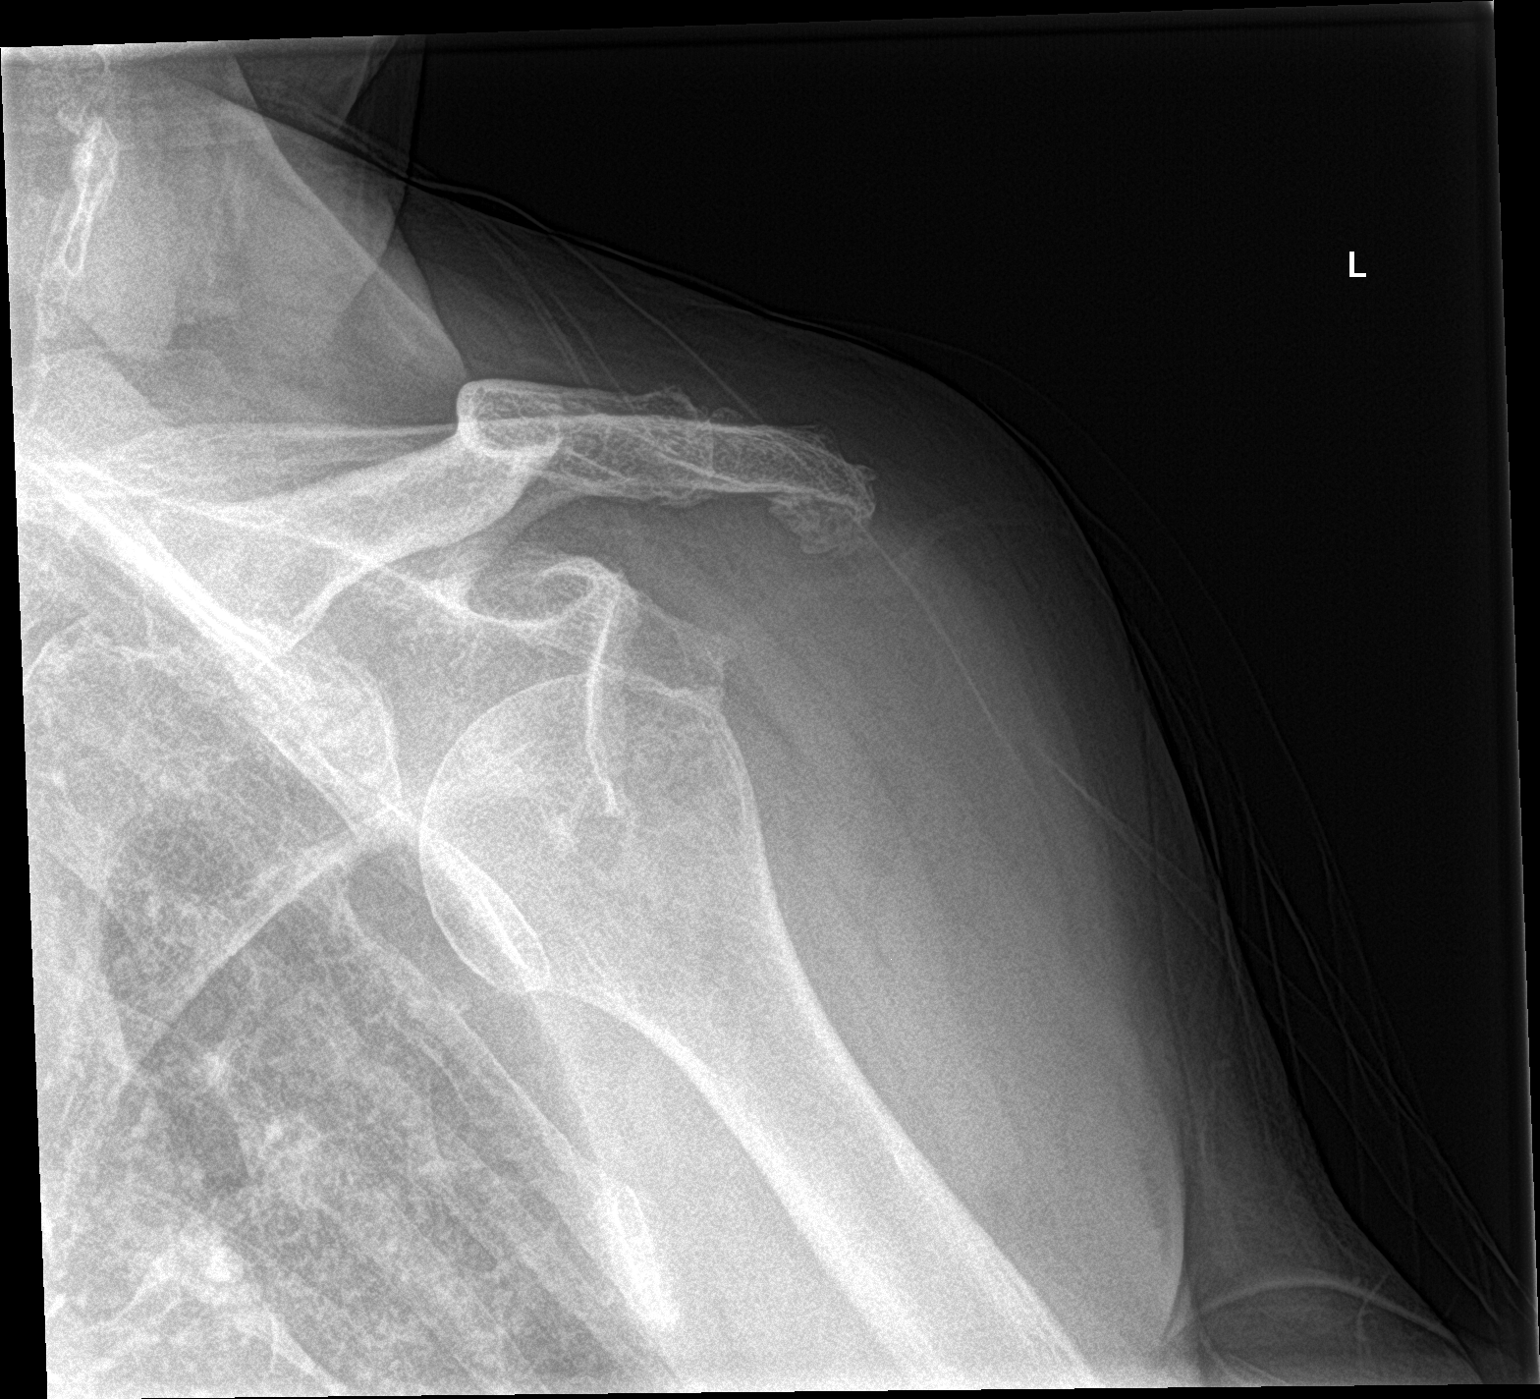

[shoulder y view]
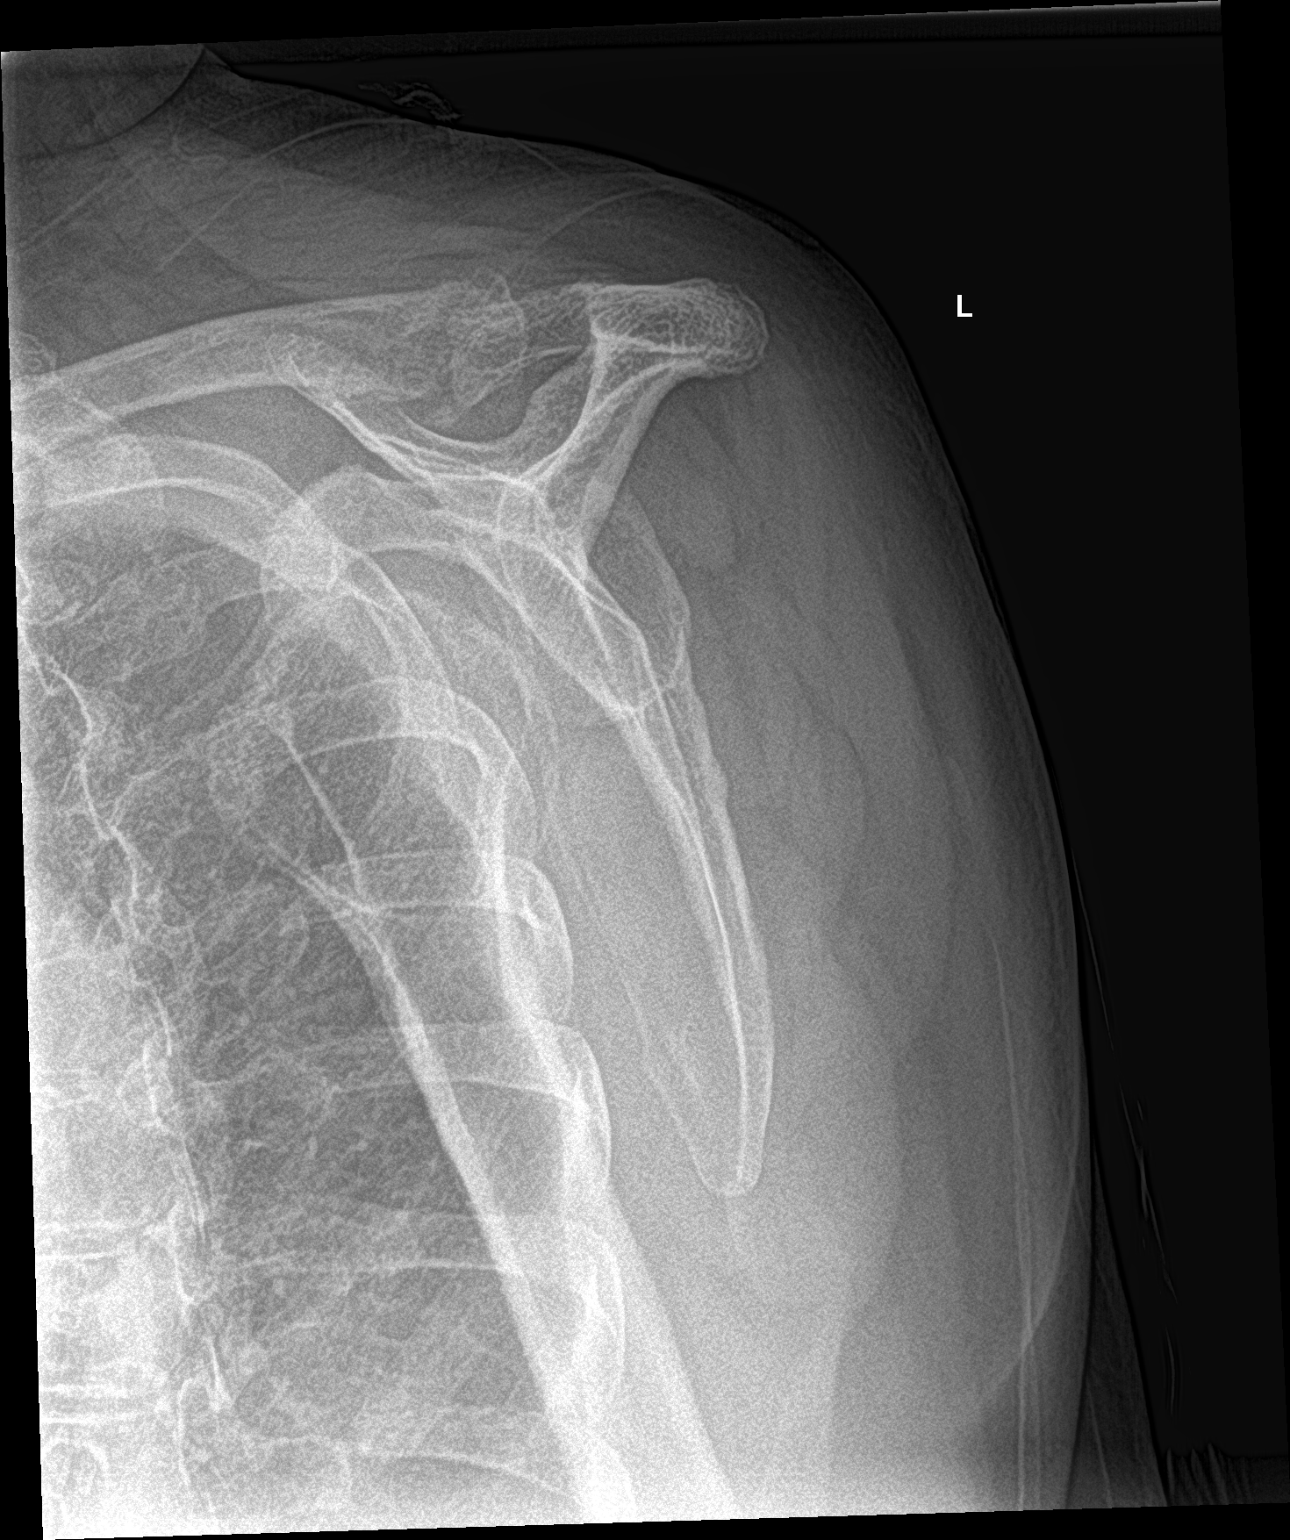

[2 of 2 positions shown; findings below may reference images not displayed]

FINDINGS: Anterior inferior subcoracoid dislocation of the humeral head noted.

No definite fracture identified.

No other acute abnormalities are present.
IMPRESSION: Anterior inferior subcoracoid dislocation of the humeral head. No
definite fracture.

## 2019-05-27 ENCOUNTER — Ambulatory Visit
Admission: RE | Admit: 2019-05-27 | Discharge: 2019-05-27 | Disposition: A | Discharge status: Home / Self Care | Payer: Medicare Other | Source: Ambulatory Visit | Attending: Internal Medicine | Admitting: Internal Medicine

## 2019-05-27 ENCOUNTER — Other Ambulatory Visit: Payer: Self-pay

## 2019-05-27 DIAGNOSIS — R935 Abnormal findings on diagnostic imaging of other abdominal regions, including retroperitoneum: Secondary | ICD-10-CM | POA: Insufficient documentation

## 2019-05-27 DIAGNOSIS — N949 Unspecified condition associated with female genital organs and menstrual cycle: Secondary | ICD-10-CM | POA: Diagnosis not present

## 2019-06-01 ENCOUNTER — Ambulatory Visit: Payer: Medicare Other | Attending: Internal Medicine

## 2019-06-01 DIAGNOSIS — Z23 Encounter for immunization: Secondary | ICD-10-CM

## 2019-06-01 NOTE — Progress Notes (Signed)
   Covid-19 Vaccination Clinic  Name:  SHANETTE TAMARGO    MRN: [id number] DOB: 1940/12/31  06/01/2019  Ms. Hudock was observed post Covid-19 immunization for 15 minutes without incident. She was provided with Vaccine Information Sheet and instruction to access the V-Safe system.   Ms. Bias was instructed to call 911 with any severe reactions post vaccine: Marland Kitchen Difficulty breathing  . Swelling of face and throat  . A fast heartbeat  . A bad rash all over body  . Dizziness and weakness   Immunizations Administered    Name Date Dose VIS Date Route   Pfizer COVID-19 Vaccine 06/01/2019  9:57 AM 0.3 mL 04/07/2018 Intramuscular   Manufacturer: [employer], [employer]   Lot: [id number]   NDC: [address]-0

## 2023-06-25 ENCOUNTER — Other Ambulatory Visit: Payer: Self-pay

## 2023-06-25 ENCOUNTER — Emergency Department
Admission: EM | Admit: 2023-06-25 | Discharge: 2023-06-25 | Disposition: A | Discharge status: Home / Self Care | Attending: Emergency Medicine | Admitting: Emergency Medicine

## 2023-06-25 ENCOUNTER — Emergency Department

## 2023-06-25 ENCOUNTER — Encounter: Payer: Self-pay | Admitting: Emergency Medicine

## 2023-06-25 DIAGNOSIS — M5417 Radiculopathy, lumbosacral region: Secondary | ICD-10-CM | POA: Diagnosis not present

## 2023-06-25 DIAGNOSIS — M1612 Unilateral primary osteoarthritis, left hip: Secondary | ICD-10-CM

## 2023-06-25 DIAGNOSIS — I10 Essential (primary) hypertension: Secondary | ICD-10-CM | POA: Diagnosis not present

## 2023-06-25 DIAGNOSIS — R0602 Shortness of breath: Secondary | ICD-10-CM | POA: Insufficient documentation

## 2023-06-25 DIAGNOSIS — M79605 Pain in left leg: Secondary | ICD-10-CM

## 2023-06-25 LAB — MAGNESIUM: Magnesium: 1.8 mg/dL (ref 1.7–2.4)

## 2023-06-25 MED ORDER — IBUPROFEN 400 MG PO TABS
400.0000 mg | ORAL_TABLET | Freq: Once | ORAL | Status: AC
  Administered 2023-06-25: 400 mg via ORAL
  Filled 2023-06-25: qty 1

## 2023-06-25 NOTE — ED Notes (Signed)
 See triage notes. Patient c/o left leg pain that started a few days ago. Denies injury. Patient stated has been occurring intermittently for the past 2-3 years. Hurts from knee up

## 2023-06-25 NOTE — ED Provider Notes (Signed)
 Shared visit  Presents to the emergency department with left upper leg pain that also radiates down to her left lower leg.  No falls or trauma.  States that she had similar problems to her right leg but now is having issues with her left leg.  Denies any urinary or bowel incontinence.  No numbness or weakness.  Denies any back pain at this time.  Patient received Motrin with significant improvement of her pain control.  X-rays with no acute fracture or dislocation.  DVT study with no acute findings.  Patient states that his pain is feeling much better.  Clinical picture is not consistent with cauda equina or epidural compression syndrome.  No weakness, no saddle anesthesia, no urinary or bowel incontinence.  Low suspicion for occult fracture given no falls or trauma.  Possible referred pain from lower back.  Discussed close follow-up with primary care physician and possible physical therapy.  Discussed pain control and return precautions to the emergency department for any ongoing or worsening symptoms.   Viviano Ground, MD 06/25/23 1801

## 2023-06-25 NOTE — Discharge Instructions (Addendum)
 You were seen in the emergency department today for left leg pain that may be due to osteoarthritis or radiculopathy from your lower back. You can take Tylenol  1000 mg 4 times per day. If you are still having pain, you can take ibuprofen 600 mg 3 times per day with meals for the next 5 days. Please follow-up with your primary care provider as needed or in the emergency department for intolerable pain, reduced feeling in your bowel or bladder area, bowel or bladder incontinence, leg weakness or numbness, or any new or worsening symptoms.

## 2023-06-25 NOTE — ED Triage Notes (Signed)
 Patient to ED via POV for left leg pain. Started a few days ago. Denies injury. States this occurs intermittently for the past 2-3 years. Hurts from knee up.

## 2023-06-25 NOTE — ED Provider Notes (Signed)
 [facility] Provider Note    None    (approximate)   History   Leg Pain   HPI  Rhonda Bean is a 83 y.o. female presenting to the emergency department with sharp left leg pain and muscle spasms that started 1.5 weeks ago when she was sitting in her recliner. Location is in her left groin and knee. Describes pain as sharp. She also reports shortness of breath when walking. She has not taken any medications for the pain. Denies injury, saddle anesthesia, bowel or bladder incontinence, lower extremity weakness or numbness. Past medical history includes osteoporosis, hypertension, and osteoarthritis. She has a history of falls, most recently three months ago. Denies any back, hip, or knee surgeries. Pain level 7/10 at arrival.  Ambulatory with use of a cane.       Physical Exam   Triage Vital Signs: ED Triage Vitals  Encounter Vitals Group     BP 06/25/23 1427 (!) 175/76     Systolic BP Percentile --      Diastolic BP Percentile --      Pulse Rate 06/25/23 1427 70     Resp 06/25/23 1427 18     Temp 06/25/23 1427 97.9 F (36.6 C)     Temp Source 06/25/23 1427 Oral     SpO2 06/25/23 1427 97 %     Weight 06/25/23 1426 168 lb (76.2 kg)     Height 06/25/23 1426 4\' 10"  (1.473 m)     Head Circumference --      Peak Flow --      Pain Score 06/25/23 1426 8     Pain Loc --      Pain Education --      Exclude from Growth Chart --     Most recent vital signs: Vitals:   06/25/23 1427 06/25/23 1720  BP: (!) 175/76 (!) 167/68  Pulse: 70 64  Resp: 18 18  Temp: 97.9 F (36.6 C) 97.9 F (36.6 C)  SpO2: 97% 97%   General: Well-appearing, in no acute distress. Appears stated age. Head: Normocephalic, atraumatic. Neck: Supple. CV: Regular rate. No murmurs, rubs, or gallops. Peripheral pulses 2+ and symmetric. Grade 1 pitting edema b/l lower legs. Respiratory: Breath sounds clear b/l. No wheezes, rales, or rhonchi. Normal respiratory effort. Negative  Homan's sign. GI: Soft, non-distended, non-tender.  MSK: Nontender in lumbar and paraspinal regions. Tender to palpation in left groin and medial thigh. Significantly decreased ROM in left lower extremity when attempting hip flexion. Unable to perform all other hip and knee ROM of left leg. No obvious bony deformities. Strength 5/5 with dorsiflexion and plantarflexion. Skin:Warm, dry, intact. No rashes, lesions, or ecchymosis. No cyanosis or pallor. Neurological: A&Ox4 to person, place, time, and situation. Unable to perform straight leg raise of left leg due to restricted ROM. Psychiatric: Mood and affect appropriate. Thought processes coherent.   ED Results / Procedures / Treatments   Labs (all labs ordered are listed, but only abnormal results are displayed) Labs Reviewed  MAGNESIUM     EKG  Not ordered.   RADIOLOGY  X ray of left hip and knee ordered. Also ordered Doppler ultrasound of left leg.  No acute injury is seen.  There are minimal small degenerative changes in the medial joint space of the left knee and mild degenerative changes of the left hip.  Left lower extremity venous Doppler was negative for DVT.   PROCEDURES:  Critical Care performed: No  Procedures None  MEDICATIONS ORDERED IN ED: Medications  ibuprofen (ADVIL) tablet 400 mg (400 mg Oral Given 06/25/23 1525)     IMPRESSION / MDM / ASSESSMENT AND PLAN / ED COURSE  I reviewed the triage vital signs and the nursing notes.                              Differential diagnosis includes, but is not limited to hip osteoarthritis, musculoskeletal strain/sprain, lumbar radiculopathy, knee osteoarthritis, DVT, hypomagnesemia.  Patient's presentation is most consistent with acute complicated illness / injury requiring diagnostic workup.  The patient was seen today for left leg pain in her groin and knee. The pain could be due to her osteoarthritis or lumbar radiculopathy. I ordered x-rays of the left left  hip and knee to look for injury or fracture as well as a Doppler ultrasound to rule out a DVT.  This imaging was independently reviewed and interpreted by me as well as the radiologist.  I agree with the radiologist's report. Also ordered magnesium  which was normal at 1.8.  She was given ibuprofen 400 mg once for her pain. Pain level reduced from a 7 to a 3. Her most recent labs from May 27, 2023 show normal kidney function with creatinine of 0.7 and eGFR of 85.  I am going to advise her to take Tylenol  for her pain and Ibuprofen if the Tylenol  is insufficient in reducing her pain. I will not give her a muscle relaxer due to her age.  She told me she does take magnesium  and has missed 2 or 3 doses in the last week, so I emphasized the need to resume that.  She can follow-up with her primary care provider as needed or in the emerged department for for any new or worsening symptoms or red flag symptoms (discussed in her discharge paperwork).   Emergency department return precautions were discussed with the patient.  Patient is in agreement to the treatment plan.  Patient is stable for discharge.    FINAL CLINICAL IMPRESSION(S) / ED DIAGNOSES   Final diagnoses:  Left leg pain  Osteoarthritis of left hip, unspecified osteoarthritis type  Left lumbosacral radiculopathy     Rx / DC Orders   ED Discharge Orders     None        Note:  This document was prepared using Dragon voice recognition software and may include unintentional dictation errors.    Thomasenia Flesher, PA-C 06/25/23 1813    Viviano Ground, MD 06/25/23 Jerre Moots
